# Patient Record
Sex: Female | Born: 1970 | Race: White | Hispanic: No | Smoking: Current every day smoker
Health system: Southern US, Community
[De-identification: ages and names within clinical notes are randomized; demographics above are authoritative.]

## PROBLEM LIST (undated history)

## (undated) DIAGNOSIS — R519 Headache, unspecified: Secondary | ICD-10-CM

## (undated) DIAGNOSIS — K219 Gastro-esophageal reflux disease without esophagitis: Secondary | ICD-10-CM

## (undated) DIAGNOSIS — T7840XA Allergy, unspecified, initial encounter: Secondary | ICD-10-CM

## (undated) DIAGNOSIS — G43909 Migraine, unspecified, not intractable, without status migrainosus: Secondary | ICD-10-CM

## (undated) HISTORY — DX: Migraine, unspecified, not intractable, without status migrainosus: G43.909

## (undated) HISTORY — DX: Gastro-esophageal reflux disease without esophagitis: K21.9

## (undated) HISTORY — DX: Allergy, unspecified, initial encounter: T78.40XA

## (undated) HISTORY — DX: Headache, unspecified: R51.9

---

## 2006-11-09 ENCOUNTER — Emergency Department: Payer: Self-pay | Admitting: Internal Medicine

## 2006-11-16 ENCOUNTER — Emergency Department: Payer: Self-pay | Admitting: Emergency Medicine

## 2006-11-30 ENCOUNTER — Encounter: Payer: Self-pay | Admitting: Obstetrics and Gynecology

## 2007-01-11 ENCOUNTER — Encounter: Payer: Self-pay | Admitting: Maternal and Fetal Medicine

## 2007-04-05 ENCOUNTER — Encounter: Payer: Self-pay | Admitting: Obstetrics and Gynecology

## 2007-06-22 ENCOUNTER — Observation Stay: Payer: Self-pay

## 2007-06-26 ENCOUNTER — Observation Stay: Payer: Self-pay

## 2007-06-27 ENCOUNTER — Inpatient Hospital Stay: Payer: Self-pay | Admitting: Obstetrics and Gynecology

## 2007-06-29 HISTORY — PX: TUBAL LIGATION: SHX77

## 2014-08-08 HISTORY — PX: BREAST CYST ASPIRATION: SHX578

## 2014-10-15 ENCOUNTER — Ambulatory Visit: Payer: Self-pay

## 2014-10-16 ENCOUNTER — Ambulatory Visit: Payer: Self-pay

## 2014-10-20 ENCOUNTER — Encounter: Payer: Self-pay | Admitting: *Deleted

## 2014-10-27 ENCOUNTER — Ambulatory Visit (INDEPENDENT_AMBULATORY_CARE_PROVIDER_SITE_OTHER): Payer: PRIVATE HEALTH INSURANCE | Admitting: General Surgery

## 2014-10-27 ENCOUNTER — Encounter: Payer: Self-pay | Admitting: General Surgery

## 2014-10-27 ENCOUNTER — Other Ambulatory Visit: Payer: PRIVATE HEALTH INSURANCE

## 2014-10-27 VITALS — BP 138/72 | HR 78 | Resp 14 | Ht 66.0 in | Wt 143.0 lb

## 2014-10-27 DIAGNOSIS — N6002 Solitary cyst of left breast: Secondary | ICD-10-CM

## 2014-10-27 NOTE — Progress Notes (Signed)
Patient ID: Tiffany Benton, female   DOB: 11-Dec-1970, 44 y.o.   MRN: [id number]  Chief Complaint  Patient presents with  . Other    mammogram    HPI Tiffany Benton is a 44 y.o. female who presents for a breast evaluation for a palpable mass in the left breast. The most recent mammogram and ultrasound was done on 10/16/14. Patient does perform regular self breast checks and gets regular mammograms done. She first noticed the lump in her left breast in November last year. She does report some tenderness.     HPI  Past Medical History  Diagnosis Date  . Migraine     Past Surgical History  Procedure Laterality Date  . Tubal ligation  06/29/07    Family History  Problem Relation Age of Onset  . Breast cancer Mother 55    metastatic  . Bone cancer Maternal Grandfather     Social History History  Substance Use Topics  . Smoking status: Current Every Day Smoker -- 1.00 packs/day for 25 years    Types: Cigarettes  . Smokeless tobacco: Not on file  . Alcohol Use: 0.0 oz/week    0 Standard drinks or equivalent per week    No Known Allergies  Current Outpatient Prescriptions  Medication Sig Dispense Refill  . Aspirin-Salicylamide-Caffeine [id number] MG PACK Take by mouth.     No current facility-administered medications for this visit.    Review of Systems Review of Systems  Constitutional: Negative.   Respiratory: Negative.   Cardiovascular: Negative.     Blood pressure 138/72, pulse 78, resp. rate 14, height 5\' 6"  (1.676 m), weight 143 lb (64.864 kg), last menstrual period 10/12/2014.  Physical Exam Physical Exam  Constitutional: She is oriented to person, place, and time. She appears well-developed and well-nourished.  Eyes: Conjunctivae are normal. No scleral icterus.  Neck: Neck supple.  Cardiovascular: Normal rate, regular rhythm and normal heart sounds.   Pulmonary/Chest: Effort normal and breath sounds normal. Right breast exhibits no  inverted nipple, no mass, no nipple discharge, no skin change and no tenderness. Left breast exhibits no inverted nipple, no mass, no nipple discharge, no skin change and no tenderness.  2.5 cm mass in the upper outer quadrant at 2 ocl of left breast, 3-4 cm from the nipple.   Abdominal: Soft. Bowel sounds are normal. There is no tenderness.  Lymphadenopathy:    She has no cervical adenopathy.    She has no axillary adenopathy.  Neurological: She is alert and oriented to person, place, and time.  Skin: Skin is warm and dry.    Data Reviewed Mammogram and Ultrasound reviewed-multiple cysts noted  Assessment    Multiple cysts in breasts. One larger 2-3 cm cyst in the upper outer quadrant of the left breast, around 2 ocl. Cyst was aspirated with US guidance- 8 ml benign appearing fluid removed and discarded. Cyst resolve  Fully and the palpable mass resolved also    Plan    Patient to return as needed. She can call if she has any further questions or concerns. Advised to seek attention if she feels a mass again       [facility] G 10/29/2014, 9:42 AM

## 2014-10-29 ENCOUNTER — Encounter: Payer: Self-pay | Admitting: General Surgery

## 2017-12-06 ENCOUNTER — Encounter: Payer: Self-pay | Admitting: Emergency Medicine

## 2017-12-06 ENCOUNTER — Emergency Department
Admission: EM | Admit: 2017-12-06 | Discharge: 2017-12-06 | Disposition: A | Discharge status: Home / Self Care | Payer: Self-pay | Attending: Emergency Medicine | Admitting: Emergency Medicine

## 2017-12-06 ENCOUNTER — Other Ambulatory Visit: Payer: Self-pay

## 2017-12-06 ENCOUNTER — Emergency Department: Payer: Self-pay

## 2017-12-06 DIAGNOSIS — R109 Unspecified abdominal pain: Secondary | ICD-10-CM

## 2017-12-06 DIAGNOSIS — K2970 Gastritis, unspecified, without bleeding: Secondary | ICD-10-CM | POA: Insufficient documentation

## 2017-12-06 DIAGNOSIS — F17210 Nicotine dependence, cigarettes, uncomplicated: Secondary | ICD-10-CM | POA: Insufficient documentation

## 2017-12-06 LAB — BASIC METABOLIC PANEL
Anion gap: 7 (ref 5–15)
BUN: 12 mg/dL (ref 6–20)
CALCIUM: 8.7 mg/dL — AB (ref 8.9–10.3)
CO2: 24 mmol/L (ref 22–32)
CREATININE: 0.84 mg/dL (ref 0.44–1.00)
Chloride: 108 mmol/L (ref 101–111)
GFR calc non Af Amer: >60 mL/min (ref >60)
Glucose, Bld: 87 mg/dL (ref 65–99)
Potassium: 3.5 mmol/L (ref 3.5–5.1)
SODIUM: 139 mmol/L (ref 135–145)

## 2017-12-06 LAB — URINALYSIS, COMPLETE (UACMP) WITH MICROSCOPIC
BILIRUBIN URINE: NEGATIVE
Glucose, UA: NEGATIVE mg/dL
KETONES UR: NEGATIVE mg/dL
Leukocytes, UA: NEGATIVE
Nitrite: NEGATIVE
Protein, ur: NEGATIVE mg/dL
Specific Gravity, Urine: 1.021 (ref 1.005–1.030)
pH: 5 (ref 5.0–8.0)

## 2017-12-06 LAB — HEPATIC FUNCTION PANEL
ALK PHOS: 71 U/L (ref 38–126)
ALT: 11 U/L — AB (ref 14–54)
AST: 19 U/L (ref 15–41)
Albumin: 3.9 g/dL (ref 3.5–5.0)
Bilirubin, Direct: <0.1 mg/dL — ABNORMAL LOW (ref 0.1–0.5)
TOTAL PROTEIN: 7.1 g/dL (ref 6.5–8.1)
Total Bilirubin: 0.4 mg/dL (ref 0.3–1.2)

## 2017-12-06 LAB — POCT PREGNANCY, URINE: Preg Test, Ur: NEGATIVE

## 2017-12-06 LAB — CBC
HCT: 43.8 % (ref 35.0–47.0)
Hemoglobin: 14.8 g/dL (ref 12.0–16.0)
MCH: 33.1 pg (ref 26.0–34.0)
MCHC: 33.8 g/dL (ref 32.0–36.0)
MCV: 98 fL (ref 80.0–100.0)
PLATELETS: 206 10*3/uL (ref 150–440)
RBC: 4.47 MIL/uL (ref 3.80–5.20)
RDW: 14.4 % (ref 11.5–14.5)
WBC: 14.1 10*3/uL — ABNORMAL HIGH (ref 3.6–11.0)

## 2017-12-06 LAB — LIPASE, BLOOD: LIPASE: 27 U/L (ref 11–51)

## 2017-12-06 MED ORDER — GI COCKTAIL ~~LOC~~
30.0000 mL | Freq: Once | ORAL | Status: AC
  Administered 2017-12-06: 30 mL via ORAL
  Filled 2017-12-06: qty 30

## 2017-12-06 MED ORDER — FAMOTIDINE 40 MG PO TABS: 40.0000 mg | ORAL_TABLET | Freq: Every evening | ORAL | 1 refills | Status: DC

## 2017-12-06 MED ORDER — IOPAMIDOL (ISOVUE-370) INJECTION 76%
75.0000 mL | Freq: Once | INTRAVENOUS | Status: AC | PRN
  Administered 2017-12-06: 75 mL via INTRAVENOUS

## 2017-12-06 MED ORDER — SUCRALFATE 1 G PO TABS: 1.0000 g | ORAL_TABLET | Freq: Four times a day (QID) | ORAL | 0 refills | Status: DC

## 2017-12-06 NOTE — ED Notes (Signed)
Lab notified to add on urine culture 

## 2017-12-06 NOTE — ED Triage Notes (Signed)
Patient complaining of right flank pain X 2-3 days getting gradually worse.  Denies dysuria.  States pain is "just there and constant".  Describes pain as sharp at times and constant soreness.

## 2017-12-06 NOTE — ED Notes (Signed)
EDP to bedside. 

## 2017-12-06 NOTE — ED Provider Notes (Signed)
[facility] Emergency Department Provider Note  ________________________________________   I have reviewed the triage vital signs and the nursing notes.   HISTORY  Chief Complaint Abdominal pain  History limited by: Not Limited   HPI Tiffany Benton is a 47 y.o. female who presents to the emergency department today because of concerns for abdominal pain.  Is located on the right side.  Started 3 days ago.  Located under her right ribs and radiates down the right side of her abdomen.  She states it is a constant ache however it does become sharp.  She has not noticed any change to the pain with eating.  Has not had any associated nausea or vomiting.  No change defecation or urination.  Patient denies any fevers.  Denies similar symptoms in the past.   Per medical record review patient has a history of migraine  Past Medical History:  Diagnosis Date  . Migraine     There are no active problems to display for this patient.   Past Surgical History:  Procedure Laterality Date  . TUBAL LIGATION  06/29/07    Prior to Admission medications   Medication Sig Start Date End Date Taking? Authorizing Provider  Aspirin-Salicylamide-Caffeine [id number] MG PACK Take by mouth.    [provider]    Allergies Patient has no known allergies.  Family History  Problem Relation Age of Onset  . Breast cancer Mother 37       metastatic  . Bone cancer Maternal Grandfather     Social History Social History   Tobacco Use  . Smoking status: Current Every Day Smoker    Packs/day: 1.00    Years: 25.00    Pack years: 25.00    Types: Cigarettes  Substance Use Topics  . Alcohol use: Yes    Alcohol/week: 0.0 oz  . Drug use: No    Review of Systems Constitutional: No fever/chills Eyes: No visual changes. ENT: No sore throat. Cardiovascular: Denies chest pain. Respiratory: Denies shortness of breath. Gastrointestinal: Positive for  right-sided abdominal pain Genitourinary: Negative for dysuria. Musculoskeletal: Negative for back pain. Skin: Negative for rash. Neurological: Negative for headaches, focal weakness or numbness.  ____________________________________________   PHYSICAL EXAM:  VITAL SIGNS: ED Triage Vitals  Enc Vitals Group     BP 12/06/17 1052 134/73     Pulse Rate 12/06/17 1052 83     Resp 12/06/17 1052 16     Temp 12/06/17 1052 98.2 F (36.8 C)     Temp Source 12/06/17 1052 Oral     SpO2 12/06/17 1052 95 %     Weight 12/06/17 1053 150 lb (68 kg)     Height 12/06/17 1053 5\' 6"  (1.676 m)     Head Circumference --      Peak Flow --      Pain Score 12/06/17 1052 4   Constitutional: Alert and oriented. Well appearing and in no distress. Eyes: Conjunctivae are normal.  ENT   Head: Normocephalic and atraumatic.   Nose: No congestion/rhinnorhea.   Mouth/Throat: Mucous membranes are moist.   Neck: No stridor. Hematological/Lymphatic/Immunilogical: No cervical lymphadenopathy. Cardiovascular: Normal rate, regular rhythm.  No murmurs, rubs, or gallops.  Respiratory: Normal respiratory effort without tachypnea nor retractions. Breath sounds are clear and equal bilaterally. No wheezes/rales/rhonchi. Gastrointestinal: Soft and non tender. No rebound. No guarding.  Genitourinary: Deferred Musculoskeletal: Normal range of motion in all extremities. No lower extremity edema. Neurologic:  Normal speech and language. No gross focal neurologic  deficits are appreciated.  Skin:  Skin is warm, dry and intact. No rash noted. Psychiatric: Mood and affect are normal. Speech and behavior are normal. Patient exhibits appropriate insight and judgment.  ____________________________________________    LABS (pertinent positives/negatives)  UA hazy, moderate urine dipstick, 11-20 RBCs, 0-5 WBCs BMP wnl except ca 8.7 CBC wbc 14.1, hgb 14.8, plt 206 Lipase 27 Hepatic function panel alt 11, d bili  <0.1 ____________________________________________   EKG  None  ____________________________________________    RADIOLOGY  CT abd.pel Findings consistent with possible gastritis  ____________________________________________   PROCEDURES  Procedures  ____________________________________________   INITIAL IMPRESSION / ASSESSMENT AND PLAN / ED COURSE  Pertinent labs & imaging results that were available during my care of the patient were reviewed by me and considered in my medical decision making (see chart for details).  Patient presented to the emergency department today because of concern for abdominal pain. On exam patient is tender on the right side of the abdomen. Differential would be broad including hepatitis, pancreatitis, gallbladder disease, appendicitis amongst other etiologies. Blood work and ct scan were performed. CT scan concerning for gastritis. Patient did feel better after gi cocktail. Will plan on discharging with antacid and sucralfate. Discussed findings with patient.    ____________________________________________   FINAL CLINICAL IMPRESSION(S) / ED DIAGNOSES  Final diagnoses:  Gastritis, presence of bleeding unspecified, unspecified chronicity, unspecified gastritis type  Abdominal pain, unspecified abdominal location     Note: This dictation was prepared with Dragon dictation. Any transcriptional errors that result from this process are unintentional     Nance Pear, MD 12/06/17 [id number]

## 2017-12-06 NOTE — ED Notes (Signed)
Patient transported to CT 

## 2017-12-06 NOTE — Discharge Instructions (Addendum)
Please seek medical attention for any high fevers, chest pain, shortness of breath, change in behavior, persistent vomiting, bloody stool or any other new or concerning symptoms.  

## 2017-12-07 LAB — URINE CULTURE: Culture: NO GROWTH

## 2018-10-03 ENCOUNTER — Ambulatory Visit: Payer: Self-pay | Attending: Oncology | Admitting: *Deleted

## 2018-10-03 ENCOUNTER — Encounter (INDEPENDENT_AMBULATORY_CARE_PROVIDER_SITE_OTHER): Payer: Self-pay

## 2018-10-03 VITALS — BP 129/89 | HR 83 | Temp 97.9°F | Ht 67.0 in | Wt 157.4 lb

## 2018-10-03 DIAGNOSIS — N644 Mastodynia: Secondary | ICD-10-CM

## 2018-10-03 NOTE — Patient Instructions (Signed)
Gave patient hand-out, Women Staying Healthy, Active and Well from [facility], with education on breast health, pap smears, heart and colon health. 

## 2018-10-03 NOTE — Progress Notes (Signed)
  Subjective:     Patient ID: Tiffany Benton, female   DOB: 01-25-1971, 48 y.o.   MRN: [id number]  HPI   Review of Systems     Objective:   Physical Exam Chest:     Breasts:        Right: No swelling, bleeding, inverted nipple, mass, nipple discharge, skin change or tenderness.        Left: Tenderness present. No bleeding, inverted nipple, mass, nipple discharge or skin change.    Lymphadenopathy:     Upper Body:     Right upper body: No supraclavicular or axillary adenopathy.     Left upper body: No supraclavicular or axillary adenopathy.        Assessment:     48 year old White female presents to [facility] for clinical breast exam and mammogram.  Patient states she has been having left breast pain.  States it's "constantly sore feeling".  States the soreness is all over, but she has more targeted pain at 6-8:00 along the inframammary ridge.States raising up on her bra strap seems to make it better.  States she does not wear an underwire bra.  States she does drink about 3 16 oz [employer] a day.  On clinical breast exam there is no dominant mass, skin changes or lymphadenopathy.  There is thickening at bilateral upper outer quadrants.  Review of last mammogram and ultrasound on 10/16/14 was a birads 2 with bilateral multiple cysts noted.  Taught self breast awareness.  Patient is currently on her menstrual cycle.  States she will get her pap at [facility] next month. Patient has been screened for eligibility.  She does not have any insurance, Medicare or Medicaid.  She also meets financial eligibility.  Hand-out given on the Affordable Care Act. Risk Assessment    Risk Scores      10/03/2018   Last edited by: Orson Slick, CMA   5-year risk: 2.6 %   Lifetime risk: 21.9 %             Plan:     Bilateral diagnostic mammogram and ultrasound ordered.  Will follow-up per [facility] protocol.

## 2018-10-08 ENCOUNTER — Ambulatory Visit
Admission: RE | Admit: 2018-10-08 | Discharge: 2018-10-08 | Disposition: A | Discharge status: Home / Self Care | Payer: Self-pay | Source: Ambulatory Visit | Attending: Oncology | Admitting: Oncology

## 2018-10-08 ENCOUNTER — Encounter: Payer: Self-pay | Admitting: *Deleted

## 2018-10-08 ENCOUNTER — Other Ambulatory Visit: Payer: Self-pay | Admitting: *Deleted

## 2018-10-08 DIAGNOSIS — N644 Mastodynia: Secondary | ICD-10-CM

## 2018-10-08 DIAGNOSIS — N63 Unspecified lump in unspecified breast: Secondary | ICD-10-CM

## 2018-10-08 NOTE — Progress Notes (Signed)
Letter mailed to inform patient of the need to return in 6 months for another mammogram and ultrasound of her left breast.  Appointment scheduled for April 11, 2019 @ 10:20.  [facility] to [place].

## 2018-11-08 ENCOUNTER — Encounter: Payer: Self-pay | Admitting: *Deleted

## 2019-04-11 ENCOUNTER — Other Ambulatory Visit: Payer: Self-pay

## 2019-12-13 ENCOUNTER — Encounter: Payer: Self-pay | Admitting: Family Medicine

## 2019-12-13 ENCOUNTER — Other Ambulatory Visit: Payer: Self-pay

## 2019-12-13 ENCOUNTER — Ambulatory Visit (INDEPENDENT_AMBULATORY_CARE_PROVIDER_SITE_OTHER): Payer: Medicaid Other | Admitting: Family Medicine

## 2019-12-13 VITALS — BP 129/53 | HR 79 | Temp 97.3°F | Ht 67.0 in | Wt 163.2 lb

## 2019-12-13 DIAGNOSIS — H6983 Other specified disorders of Eustachian tube, bilateral: Secondary | ICD-10-CM

## 2019-12-13 DIAGNOSIS — R202 Paresthesia of skin: Secondary | ICD-10-CM | POA: Diagnosis not present

## 2019-12-13 DIAGNOSIS — R200 Anesthesia of skin: Secondary | ICD-10-CM | POA: Diagnosis not present

## 2019-12-13 DIAGNOSIS — R2231 Localized swelling, mass and lump, right upper limb: Secondary | ICD-10-CM | POA: Diagnosis not present

## 2019-12-13 DIAGNOSIS — Z1231 Encounter for screening mammogram for malignant neoplasm of breast: Secondary | ICD-10-CM | POA: Diagnosis not present

## 2019-12-13 DIAGNOSIS — Z7689 Persons encountering health services in other specified circumstances: Secondary | ICD-10-CM

## 2019-12-13 DIAGNOSIS — Z0000 Encounter for general adult medical examination without abnormal findings: Secondary | ICD-10-CM | POA: Diagnosis not present

## 2019-12-13 DIAGNOSIS — H6993 Unspecified Eustachian tube disorder, bilateral: Secondary | ICD-10-CM | POA: Insufficient documentation

## 2019-12-13 MED ORDER — FLUTICASONE PROPIONATE 50 MCG/ACT NA SUSP
2.0000 | Freq: Every day | NASAL | 3 refills | Status: DC
Start: 2019-12-13 — End: 2023-10-02

## 2019-12-13 MED ORDER — PREDNISONE 20 MG PO TABS: 40.0000 mg | ORAL_TABLET | Freq: Every day | ORAL | 0 refills | Status: AC

## 2019-12-13 MED ORDER — GUAIFENESIN ER 1200 MG PO TB12: 1.0000 | ORAL_TABLET | Freq: Two times a day (BID) | ORAL | 0 refills | Status: DC

## 2019-12-13 MED ORDER — LORATADINE 10 MG PO TABS: 10.0000 mg | ORAL_TABLET | Freq: Every day | ORAL | 1 refills | Status: AC

## 2019-12-13 NOTE — Assessment & Plan Note (Signed)
Here to establish as new patient at [facility] 12/13/2019.  Reports completed 1-2 visits with previous PCP, Ranae Plumber, before COVID and otherwise has been without primary care.

## 2019-12-13 NOTE — Assessment & Plan Note (Signed)
Reports bilateral ear fullness with drainage, swishing sounds in the left ear x 1 year and bilateral high pitch noise x 6 months.  Reports had been seen in an urgent care around the beginning of COVID and was diagnosed with seasonal allergies. Had started a daily zyrtec, flonase and popping her ears with nasal pressure with some relief of symptoms but not full relief of symptoms.  Has not continued these treatments.  Discussed is eustachian tube dysfunction, will restart on allergy control medications as well as mucinex and will do a 5 day course of prednisone to help with clearing of secretions.  Discussed if this does not clear up concerns, will refer to ENT for additional evaluation.  Plan: 1. Take medications as prescribed (loratadine, flonase, mucinex and prednisone). 2. Follow up in 1 week

## 2019-12-13 NOTE — Patient Instructions (Signed)
As we discussed, you have eustachian tube dysfunction in both ears.  I have sent in a few prescriptions for this.  Loratadine 10mg  daily (generic for claritin).  Take 1 tablet daily for allergy management Flonase (2 sprays in each nostril daily).  Take to help with clearing of allergens Mucinex - 1 tablet 2x per day for the next 5 days.  This will help thin the fluids that are in your ears so they are easier to clear Prednisone 20mg  (take 40mg  once daily - 2 tablets) for the next 5 days.  I have sent in a referral to Orthopedics for evaluation of the subcutaneous mass in your right hand.  If you do not hear anything in 1 week from either the referral coordinator or the orthopedic practice, please contact our office and we will touch base with them.  As we discussed, appears to be radiculopathy in your left leg.  The prednisone that you will be taking for your Eustachian Tube Dysfunction can also help reduce your left leg symptoms.  We will plan to see you back in 1 week for follow up on your left leg symptoms, ear symptoms, for PAP testing and your TDAP vaccination.  You will receive a survey after today's visit either digitally by e-mail or paper by [employer]. Your experiences and feedback matter to [place].  Please respond so we know how we are doing as we provide care for you.  Call us with any questions/concerns/needs.  It is my goal to be available to you for your health concerns.  Thanks for choosing me to be a partner in your healthcare needs!  Harlin Rain, FNP-C Family Nurse Practitioner [facility] Phone: [phone]

## 2019-12-13 NOTE — Progress Notes (Signed)
Subjective:    Patient ID: Tiffany Benton, female    DOB: October 09, 1970, 49 y.o.   MRN: [id number]  Tiffany Benton is a 49 y.o. female presenting on 12/13/2019 for Establish Care (bilateral ear fullness w/ drainage, swishing sounds in the Lt ear. x 1 yr  Bilateral high pitch sounds x 69mhs), Leg Problem (intermittent left leg numbness, burning sensation  in the buttocks that radiates down leg. x 1 Pt unsure that the leg pain could be related to a fall x1 yr ago. She tripped over her dog and landed on the left side.), and Hand Pain (Right hand pain w/ knot about 1cm in the palm of the hand )   HPI  Previous PCP was LRanae Plumber  Records will be requested.  Past medical, family, and surgical history reviewed w/ pt.  Tiffany Benton to clinic to establish care and for concerns of bilateral ears, left leg and right hand.  Reports having bilateral ear fullness without drainage, swishing sounds in the left ear x 1 year and bilateral high pitch noise x 6 months.  Reports was seen in urgent care last March and diagnosed with seasonal allergies, was told to take over the counter allergy medication, flonase and to hold her nose to pop her ears open.   Reports some symptom improvement but not full resolution.  Has stopped these over the counter medications.  Denies dizziness, lightheadedness, vertigo, history of recurrent ear infections, drainage/pain near/in ear, or putting anything in her ear.  Reports concerns with her right hand.  Approximately 1 month ago found a palpable mass at the base of her right 4th digit that moves when she moves her fingers.  Reports increasing tenderness since she found it 1 month ago and that she has begun to have decreased grip strength in her right hand.  Denies any previous right hand injury or diagnoses.  Has not met with any providers regarding this.  Has concerns of left left numbness/tingling down her entire leg and including her entire left foot.   Had a fall >1 year ago landing on her left side and has been having symptoms since.  Denies any changes in bowel/bladder function, saddle anesthesia, foot drop, problems with ambulation.  Reports once in a while can change positions and have temporary relief of her symptoms.  Denies any previous injuries/trauma, back diagnoses, symptoms or treatment.   Depression screen PHQ 2/9 12/13/2019  Decreased Interest 0  Down, Depressed, Hopeless 0  PHQ - 2 Score 0    Past Medical History:  Diagnosis Date  . Allergy   . Frequent headaches   . Migraine    Past Surgical History:  Procedure Laterality Date  . BREAST CYST ASPIRATION Left 2016   per pt  . TUBAL LIGATION  06/29/07   Social History   Socioeconomic History  . Marital status: Single    Spouse name: Not on file  . Number of children: Not on file  . Years of education: Not on file  . Highest education level: Not on file  Occupational History  . Not on file  Tobacco Use  . Smoking status: Current Every Day Smoker    Packs/day: 1.00    Years: 25.00    Pack years: 25.00    Types: Cigarettes  . Smokeless tobacco: Never Used  Substance and Sexual Activity  . Alcohol use: Not Currently    Alcohol/week: 0.0 standard drinks  . Drug use: No  . Sexual activity: Not on file  Other Topics Concern  . Not on file  Social History Narrative  . Not on file   Social Determinants of Health   Financial Resource Strain:   . Difficulty of Paying Living Expenses:   Food Insecurity:   . Worried About Charity fundraiser in the Last Year:   . Arboriculturist in the Last Year:   Transportation Needs:   . Film/video editor (Medical):   Marland Kitchen Lack of Transportation (Non-Medical):   Physical Activity:   . Days of Exercise per Week:   . Minutes of Exercise per Session:   Stress:   . Feeling of Stress :   Social Connections:   . Frequency of Communication with Friends and Family:   . Frequency of Social Gatherings with Friends and  Family:   . Attends Religious Services:   . Active Member of Clubs or Organizations:   . Attends Archivist Meetings:   Marland Kitchen Marital Status:   Intimate Partner Violence:   . Fear of Current or Ex-Partner:   . Emotionally Abused:   Marland Kitchen Physically Abused:   . Sexually Abused:    Family History  Problem Relation Age of Onset  . Breast cancer Mother 76       metastatic  . Bone cancer Maternal Grandfather    Current Outpatient Medications on File Prior to Visit  Medication Sig  . acetaminophen (TYLENOL) 500 MG tablet Take 500 mg by mouth every 6 (six) hours as needed.  . Aspirin-Salicylamide-Caffeine (BC FAST PAIN RELIEF) 650-195-33.3 MG PACK Take by mouth.  . calcium carbonate (TUMS - DOSED IN MG ELEMENTAL CALCIUM) 500 MG chewable tablet Chew 1 tablet by mouth daily as needed for indigestion or heartburn.   No current facility-administered medications on file prior to visit.    Per HPI unless specifically indicated above     Objective:    BP (!) 129/53   Pulse 79   Temp (!) 97.3 F (36.3 C) (Temporal)   Ht 5' 7"  (1.702 m)   Wt 163 lb 3.2 oz (74 kg)   BMI 25.56 kg/m   Wt Readings from Last 3 Encounters:  12/13/19 163 lb 3.2 oz (74 kg)  10/03/18 157 lb 6.4 oz (71.4 kg)  12/06/17 150 lb (68 kg)    Physical Exam Vitals reviewed.  Constitutional:      General: She is not in acute distress.    Appearance: Normal appearance. She is well-developed, well-groomed and overweight. She is not ill-appearing or toxic-appearing.  HENT:     Head: Normocephalic.     Right Ear: Ear canal and external ear normal. No laceration, drainage, swelling or tenderness. There is no impacted cerumen. No foreign body. No hemotympanum. Tympanic membrane is bulging. Tympanic membrane is not injected, scarred, perforated, erythematous or retracted.     Left Ear: Ear canal and external ear normal. No laceration, drainage, swelling or tenderness. There is no impacted cerumen. No foreign body. No  hemotympanum. Tympanic membrane is bulging. Tympanic membrane is not injected, scarred, perforated, erythematous or retracted.     Ears:     Comments: Clear fluid behind both TM    Nose:     Comments: Facemask is in place, covering mouth and nose     Mouth/Throat:     Lips: Pink.     Pharynx: Uvula midline.  Eyes:     General: Lids are normal. Vision grossly intact. No scleral icterus.       Right eye: No discharge.  Left eye: No discharge.     Extraocular Movements: Extraocular movements intact.     Conjunctiva/sclera: Conjunctivae normal.     Pupils: Pupils are equal, round, and reactive to light.  Neck:     Thyroid: No thyroid mass or thyromegaly.  Cardiovascular:     Rate and Rhythm: Normal rate and regular rhythm.     Pulses: Normal pulses.          Dorsalis pedis pulses are 2+ on the right side and 2+ on the left side.     Heart sounds: Normal heart sounds. No murmur. No friction rub. No gallop.   Pulmonary:     Effort: Pulmonary effort is normal. No respiratory distress.     Breath sounds: Normal breath sounds.  Abdominal:     Palpations: There is no hepatomegaly or splenomegaly.  Musculoskeletal:        General: Normal range of motion.     Right wrist: Swelling and tenderness present. No deformity, bony tenderness or snuff box tenderness. Normal range of motion.     Cervical back: Normal, normal range of motion and neck supple. No tenderness.     Thoracic back: Normal.     Lumbar back: Normal.     Right lower leg: Normal. No edema.     Left lower leg: Normal. No edema.     Right ankle: Normal.     Left ankle: Normal.     Right foot: Normal.     Left foot: Normal.     Comments: Subcutaneous mass, right hand, base of 4th digit, palpable and moves with finger movement.  Feet:     Right foot:     Skin integrity: Skin integrity normal.     Left foot:     Skin integrity: Skin integrity normal.  Lymphadenopathy:     Cervical: No cervical adenopathy.  Skin:     General: Skin is warm and dry.     Capillary Refill: Capillary refill takes less than 2 seconds.  Neurological:     General: No focal deficit present.     Mental Status: She is alert and oriented to person, place, and time.     Cranial Nerves: No cranial nerve deficit.     Sensory: No sensory deficit.     Motor: No weakness.     Coordination: Coordination normal.     Gait: Gait normal.     Deep Tendon Reflexes: Reflexes normal.  Psychiatric:        Attention and Perception: Attention and perception normal.        Mood and Affect: Mood and affect normal.        Speech: Speech normal.        Behavior: Behavior normal. Behavior is cooperative.        Thought Content: Thought content normal.        Cognition and Memory: Cognition and memory normal.        Judgment: Judgment normal.     Results for orders placed or performed during the hospital encounter of 12/06/17  Urine Culture   Specimen: Urine, Random  Result Value Ref Range   Specimen Description      URINE, RANDOM Performed at [facility], [address], [place], [place] [address]    Special Requests      NONE Performed at [facility], [address] [place], [place] [address]    Culture      NO GROWTH Performed at [facility], [address] [place], [place] [address]    Report Status 12/07/2017 FINAL   Urinalysis, Complete w Microscopic  Result Value Ref Range   Color, Urine YELLOW (A) YELLOW   APPearance HAZY (A) CLEAR   Specific Gravity, Urine 1.021 1.005 - 1.030   pH 5.0 5.0 - 8.0   Glucose, UA NEGATIVE NEGATIVE mg/dL   Hgb urine dipstick MODERATE (A) NEGATIVE   Bilirubin Urine NEGATIVE NEGATIVE   Ketones, ur NEGATIVE NEGATIVE mg/dL   Protein, ur NEGATIVE NEGATIVE mg/dL   Nitrite NEGATIVE NEGATIVE   Leukocytes, UA NEGATIVE NEGATIVE   RBC / HPF 11-20 0 - 5 RBC/hpf   WBC, UA 0-5 0 - 5 WBC/hpf   Bacteria, UA RARE (A) NONE SEEN   Squamous Epithelial / LPF 0-5 0 - 5   Mucus  PRESENT    Hyaline Casts, UA PRESENT   Basic metabolic panel  Result Value Ref Range   Sodium 139 135 - 145 mmol/L   Potassium 3.5 3.5 - 5.1 mmol/L   Chloride 108 101 - 111 mmol/L   CO2 24 22 - 32 mmol/L   Glucose, Bld 87 65 - 99 mg/dL   BUN 12 6 - 20 mg/dL   Creatinine, Ser 0.84 0.44 - 1.00 mg/dL   Calcium 8.7 (L) 8.9 - 10.3 mg/dL   GFR calc non Af Amer >60 >60 mL/min   GFR calc Af Amer >60 >60 mL/min   Anion gap 7 5 - 15  CBC  Result Value Ref Range   WBC 14.1 (H) 3.6 - 11.0 K/uL   RBC 4.47 3.80 - 5.20 MIL/uL   Hemoglobin 14.8 12.0 - 16.0 g/dL   HCT 43.8 35.0 - 47.0 %   MCV 98.0 80.0 - 100.0 fL   MCH 33.1 26.0 - 34.0 pg   MCHC 33.8 32.0 - 36.0 g/dL   RDW 14.4 11.5 - 14.5 %   Platelets 206 150 - 440 K/uL  Lipase, blood  Result Value Ref Range   Lipase 27 11 - 51 U/L  Hepatic function panel  Result Value Ref Range   Total Protein 7.1 6.5 - 8.1 g/dL   Albumin 3.9 3.5 - 5.0 g/dL   AST 19 15 - 41 U/L   ALT 11 (L) 14 - 54 U/L   Alkaline Phosphatase 71 38 - 126 U/L   Total Bilirubin 0.4 0.3 - 1.2 mg/dL   Bilirubin, Direct <0.1 (L) 0.1 - 0.5 mg/dL   Indirect Bilirubin NOT CALCULATED 0.3 - 0.9 mg/dL  Pregnancy, urine POC  Result Value Ref Range   Preg Test, Ur NEGATIVE NEGATIVE      Assessment & Plan:   Problem List Items Addressed This Visit      Nervous and Auditory   Eustachian tube dysfunction, bilateral    Reports bilateral ear fullness with drainage, swishing sounds in the left ear x 1 year and bilateral high pitch noise x 6 months.  Reports had been seen in an urgent care around the beginning of COVID and was diagnosed with seasonal allergies. Had started a daily zyrtec, flonase and popping her ears with nasal pressure with some relief of symptoms but not full relief of symptoms.  Has not continued these treatments.  Discussed is eustachian tube dysfunction, will restart on allergy control medications as well as mucinex and will do a 5 day course of prednisone to  help with clearing of secretions.  Discussed if this does not clear up concerns, will refer to ENT for additional evaluation.  Plan: 1. Take medications as prescribed (loratadine, flonase, mucinex and prednisone). 2. Follow up in 1 week        Other   Encounter to establish care with new doctor    Here to establish as new patient at [facility] 12/13/2019.  Reports completed 1-2 visits with previous PCP, Ranae Plumber, before COVID and otherwise has been without primary care.        Numbness and tingling of left leg    History of fall x 1 year ago onto left side.  Intermittent left lower back pain but reports constant radicular symptoms of pain/numbness/tingling down left leg through foot.  Reports entire foot with feeling numbness/tingling, feels at time she has some difficulty with ambulation.  No previous low back injury/trauma/fall or known herniated disc.  Plan: 1. Prednisone prescribed for eustachian tube dysfunction should reduce/alleviate the radicular symptoms while using medication. 2. Discussed if relief of symptoms and then resumption of symptoms, will refer to Orthopedics for probable lumbar MRI and further treatment plan.      Subcutaneous mass of right hand    Subcutaneous mass of right hand, at base of 4th digit and moves with finger movement.  Reports first noticed about 1 month ago and has noticed worsening with her grip strength and interference with her ADLs.  Plan: 1. Referral to Orthopedics for probable hand MRI and further evaluation      Relevant Orders   Ambulatory referral to Orthopedic Surgery    Other Visit Diagnoses    Routine medical exam    -  Primary   Relevant Orders   CBC with Differential   COMPLETE METABOLIC PANEL WITH GFR   Lipid Profile   Thyroid Panel With TSH   Encounter for screening mammogram for malignant neoplasm of breast       Relevant Orders   MM 3D SCREEN BREAST BILATERAL   Dysfunction of both eustachian tubes       Relevant  Medications   loratadine (CLARITIN) 10 MG tablet   fluticasone (FLONASE) 50 MCG/ACT nasal spray   Guaifenesin (MUCINEX MAXIMUM STRENGTH) 1200 MG TB12   predniSONE (DELTASONE) 20 MG tablet      Meds ordered this encounter  Medications  . loratadine (CLARITIN) 10 MG tablet    Sig: Take 1 tablet (10 mg total) by mouth daily.    Dispense:  90 tablet    Refill:  1  . fluticasone (FLONASE) 50 MCG/ACT nasal spray    Sig: Place 2 sprays into both nostrils daily.    Dispense:  16 g    Refill:  3  . Guaifenesin (MUCINEX MAXIMUM STRENGTH) 1200 MG TB12    Sig: Take 1 tablet (1,200 mg total) by mouth 2 (two) times daily.    Dispense:  10 tablet    Refill:  0  . predniSONE (DELTASONE) 20 MG tablet    Sig: Take 2 tablets (40 mg total) by mouth daily with breakfast for 5 days.    Dispense:  10 tablet    Refill:  0      Follow up plan: Return in about 1 week (around 12/20/2019) for Follow up leg symptoms, ear symptoms and PAP/TDAP.  Harlin Rain, FNP-C Family Nurse Practitioner [facility] 12/13/2019, 11:39 AM

## 2019-12-13 NOTE — Assessment & Plan Note (Signed)
Subcutaneous mass of right hand, at base of 4th digit and moves with finger movement.  Reports first noticed about 1 month ago and has noticed worsening with her grip strength and interference with her ADLs.  Plan: 1. Referral to Orthopedics for probable hand MRI and further evaluation

## 2019-12-13 NOTE — Assessment & Plan Note (Signed)
History of fall x 1 year ago onto left side.  Intermittent left lower back pain but reports constant radicular symptoms of pain/numbness/tingling down left leg through foot.  Reports entire foot with feeling numbness/tingling, feels at time she has some difficulty with ambulation.  No previous low back injury/trauma/fall or known herniated disc.  Plan: 1. Prednisone prescribed for eustachian tube dysfunction should reduce/alleviate the radicular symptoms while using medication. 2. Discussed if relief of symptoms and then resumption of symptoms, will refer to Orthopedics for probable lumbar MRI and further treatment plan.

## 2019-12-16 ENCOUNTER — Other Ambulatory Visit: Payer: Self-pay

## 2019-12-16 ENCOUNTER — Other Ambulatory Visit: Payer: Medicaid Other

## 2019-12-16 DIAGNOSIS — Z0000 Encounter for general adult medical examination without abnormal findings: Secondary | ICD-10-CM | POA: Diagnosis not present

## 2019-12-17 ENCOUNTER — Other Ambulatory Visit: Payer: Self-pay | Admitting: Family Medicine

## 2019-12-17 DIAGNOSIS — D72820 Lymphocytosis (symptomatic): Secondary | ICD-10-CM

## 2019-12-17 LAB — CBC WITH DIFFERENTIAL/PLATELET
Absolute Monocytes: 663 cells/uL (ref 200–950)
Basophils Absolute: 65 cells/uL (ref 0–200)
Basophils Relative: 0.5 %
Eosinophils Absolute: 78 cells/uL (ref 15–500)
Eosinophils Relative: 0.6 %
HCT: 41.6 % (ref 35.0–45.0)
Hemoglobin: 13.8 g/dL (ref 11.7–15.5)
Lymphs Abs: 5408 cells/uL — ABNORMAL HIGH (ref 850–3900)
MCH: 32.7 pg (ref 27.0–33.0)
MCHC: 33.2 g/dL (ref 32.0–36.0)
MCV: 98.6 fL (ref 80.0–100.0)
MPV: 11.3 fL (ref 7.5–12.5)
Monocytes Relative: 5.1 %
Neutro Abs: 6786 cells/uL (ref 1500–7800)
Neutrophils Relative %: 52.2 %
Platelets: 224 10*3/uL (ref 140–400)
RBC: 4.22 10*6/uL (ref 3.80–5.10)
RDW: 12.7 % (ref 11.0–15.0)
Total Lymphocyte: 41.6 %
WBC: 13 10*3/uL — ABNORMAL HIGH (ref 3.8–10.8)

## 2019-12-17 LAB — COMPLETE METABOLIC PANEL WITH GFR
AG Ratio: 1.9 (calc) (ref 1.0–2.5)
ALT: 8 U/L (ref 6–29)
AST: 13 U/L (ref 10–35)
Albumin: 3.9 g/dL (ref 3.6–5.1)
Alkaline phosphatase (APISO): 68 U/L (ref 31–125)
BUN: 18 mg/dL (ref 7–25)
CO2: 24 mmol/L (ref 20–32)
Calcium: 8.7 mg/dL (ref 8.6–10.2)
Chloride: 110 mmol/L (ref 98–110)
Creat: 0.77 mg/dL (ref 0.50–1.10)
GFR, Est African American: 105 mL/min/{1.73_m2} (ref >=60)
GFR, Est Non African American: 91 mL/min/{1.73_m2} (ref >=60)
Globulin: 2.1 g/dL (calc) (ref 1.9–3.7)
Glucose, Bld: 74 mg/dL (ref 65–99)
Potassium: 3.6 mmol/L (ref 3.5–5.3)
Sodium: 142 mmol/L (ref 135–146)
Total Bilirubin: 0.2 mg/dL (ref 0.2–1.2)
Total Protein: 6 g/dL — ABNORMAL LOW (ref 6.1–8.1)

## 2019-12-17 LAB — LIPID PANEL
Cholesterol: 220 mg/dL — ABNORMAL HIGH (ref <200)
HDL: 46 mg/dL — ABNORMAL LOW (ref >=50)
LDL Cholesterol (Calc): 154 mg/dL (calc) — ABNORMAL HIGH
Non-HDL Cholesterol (Calc): 174 mg/dL (calc) — ABNORMAL HIGH (ref <130)
Total CHOL/HDL Ratio: 4.8 (calc) (ref <5.0)
Triglycerides: 93 mg/dL (ref <150)

## 2019-12-17 LAB — THYROID PANEL WITH TSH
Free Thyroxine Index: 1.4 (ref 1.4–3.8)
T3 Uptake: 29 % (ref 22–35)
T4, Total: 4.7 ug/dL — ABNORMAL LOW (ref 5.1–11.9)
TSH: 1.48 mIU/L

## 2019-12-20 ENCOUNTER — Encounter: Payer: Self-pay | Admitting: Family Medicine

## 2019-12-20 ENCOUNTER — Ambulatory Visit (INDEPENDENT_AMBULATORY_CARE_PROVIDER_SITE_OTHER)
Admission: RE | Admit: 2019-12-20 | Discharge: 2019-12-20 | Disposition: A | Discharge status: Home / Self Care | Payer: Medicaid Other | Source: Ambulatory Visit | Attending: Family Medicine | Admitting: Family Medicine

## 2019-12-20 ENCOUNTER — Other Ambulatory Visit: Payer: Self-pay | Admitting: Family Medicine

## 2019-12-20 ENCOUNTER — Other Ambulatory Visit: Payer: Self-pay

## 2019-12-20 ENCOUNTER — Ambulatory Visit (INDEPENDENT_AMBULATORY_CARE_PROVIDER_SITE_OTHER): Payer: Medicaid Other | Admitting: Family Medicine

## 2019-12-20 ENCOUNTER — Ambulatory Visit
Admission: RE | Admit: 2019-12-20 | Discharge: 2019-12-20 | Disposition: A | Discharge status: Home / Self Care | Payer: Medicaid Other | Attending: Family Medicine | Admitting: Family Medicine

## 2019-12-20 VITALS — BP 125/56 | HR 79 | Temp 97.5°F | Ht 67.0 in | Wt 165.8 lb

## 2019-12-20 DIAGNOSIS — H9319 Tinnitus, unspecified ear: Secondary | ICD-10-CM | POA: Insufficient documentation

## 2019-12-20 DIAGNOSIS — H6983 Other specified disorders of Eustachian tube, bilateral: Secondary | ICD-10-CM

## 2019-12-20 DIAGNOSIS — R062 Wheezing: Secondary | ICD-10-CM

## 2019-12-20 DIAGNOSIS — Z23 Encounter for immunization: Secondary | ICD-10-CM

## 2019-12-20 DIAGNOSIS — R9389 Abnormal findings on diagnostic imaging of other specified body structures: Secondary | ICD-10-CM

## 2019-12-20 DIAGNOSIS — H9313 Tinnitus, bilateral: Secondary | ICD-10-CM

## 2019-12-20 DIAGNOSIS — R202 Paresthesia of skin: Secondary | ICD-10-CM

## 2019-12-20 DIAGNOSIS — R200 Anesthesia of skin: Secondary | ICD-10-CM

## 2019-12-20 NOTE — Assessment & Plan Note (Signed)
History of fall x 1 year ago onto left side.  Continued intermittent left lower back pain with constant radiculopathy of pain, numbness and tingling down the left leg through the left foot.  Reports entire foot numb and some difficulty with ambulation at times.  Minimal improvement with 5 day Prednisone trial.  Will refer to Orthopedics.  Plan: 1. Referral to Orthopedics.

## 2019-12-20 NOTE — Assessment & Plan Note (Signed)
Due for TDAP vaccination.  Agreeable to vaccination today.  Plan: 1. TDAP Vaccine given in clinic today.

## 2019-12-20 NOTE — Progress Notes (Signed)
Subjective:    Patient ID: Tiffany Benton, female    DOB: 1971/04/29, 49 y.o.   MRN: [id number]  Tiffany Benton is a 49 y.o. female presenting on 12/20/2019 for Leg Problem (numbness and tingling of left leg. Pt state the numbness, pain is still present, but notice the burning sensation subsided after taking the steroid ) and Tinnitus (pt describe it as a persistent loud swishing sound. No improvement with taking the steroid medication)   HPI  Ms. Ocegueda presents to clinic for follow up on her left leg numbness/tingling, with continued tinnitus in her ears.  Reports completed the steroids x 5 days with improvement in the burning of her left lower extremity but still has continued numbness/tingling/pain from her lower back through her foot.  Has continued loud swishing sound, [place], but very loudly, in bilateral ears.  Reports no improvement with taking the steroid medication, flonase, mucinex or allergy medication.    Depression screen PHQ 2/9 12/13/2019  Decreased Interest 0  Down, Depressed, Hopeless 0  PHQ - 2 Score 0    Social History   Tobacco Use  . Smoking status: Current Every Day Smoker    Packs/day: 1.00    Years: 25.00    Pack years: 25.00    Types: Cigarettes  . Smokeless tobacco: Never Used  Substance Use Topics  . Alcohol use: Not Currently    Alcohol/week: 0.0 standard drinks  . Drug use: No    Review of Systems  Constitutional: Negative.   HENT: Positive for tinnitus. Negative for congestion, dental problem, drooling, ear discharge, ear pain, facial swelling, hearing loss, mouth sores, nosebleeds, postnasal drip, rhinorrhea, sinus pressure, sinus pain, sneezing, sore throat, trouble swallowing and voice change.   Eyes: Negative.   Respiratory: Negative.   Cardiovascular: Negative.   Gastrointestinal: Negative.   Endocrine: Negative.   Genitourinary: Negative.     Musculoskeletal: Positive for arthralgias and myalgias. Negative for back pain, gait problem, joint swelling, neck pain and neck stiffness.  Skin: Negative.   Allergic/Immunologic: Negative.   Neurological: Positive for numbness. Negative for dizziness, tremors, seizures, syncope, facial asymmetry, speech difficulty, weakness, light-headedness and headaches.  Hematological: Negative.   Psychiatric/Behavioral: Negative.    Per HPI unless specifically indicated above     Objective:    BP (!) 125/56   Pulse 79   Temp (!) 97.5 F (36.4 C) (Temporal)   Ht 5\' 7"  (1.702 m)   Wt 165 lb 12.8 oz (75.2 kg)   LMP 12/19/2019   BMI 25.97 kg/m   Wt Readings from Last 3 Encounters:  12/20/19 165 lb 12.8 oz (75.2 kg)  12/13/19 163 lb 3.2 oz (74 kg)  10/03/18 157 lb 6.4 oz (71.4 kg)    Physical Exam Vitals reviewed.  Constitutional:      General: She is not in acute distress.    Appearance: Normal appearance. She is well-developed, well-groomed and overweight. She is not ill-appearing or toxic-appearing.  HENT:     Head: Normocephalic.     Right Ear: No laceration, drainage, swelling or tenderness. No foreign body. No hemotympanum. Tympanic membrane is bulging. Tympanic membrane is not injected, scarred, perforated, erythematous or retracted.     Left Ear: No laceration, drainage, swelling or tenderness. No foreign body. No hemotympanum. Tympanic membrane is bulging. Tympanic membrane is not injected, scarred, perforated, erythematous or retracted.     Ears:     Comments: Clear fluid  behind both TM    Nose:     Comments: Lizbeth Bark is in place, covering mouth and nose  Eyes:     General: Lids are normal. Vision grossly intact. No scleral icterus.       Right eye: No discharge.        Left eye: No discharge.     Extraocular Movements: Extraocular movements intact.     Conjunctiva/sclera: Conjunctivae normal.     Pupils: Pupils are equal, round, and reactive to light.  Neck:     Thyroid:  No thyroid mass or thyromegaly.  Cardiovascular:     Rate and Rhythm: Normal rate and regular rhythm.     Pulses: Normal pulses.     Heart sounds: Normal heart sounds. No murmur. No friction rub. No gallop.   Pulmonary:     Effort: Pulmonary effort is normal. No tachypnea, bradypnea, accessory muscle usage, prolonged expiration, respiratory distress or retractions.     Breath sounds: No stridor. Examination of the right-lower field reveals wheezing. Wheezing present. No rhonchi or rales.     Comments: Large inspiratory/expiratory wheeze RLL Chest:     Chest wall: No tenderness.  Musculoskeletal:        General: Normal range of motion.     Cervical back: Normal and normal range of motion.     Thoracic back: Normal.     Lumbar back: Normal.     Right lower leg: Normal. No edema.     Left lower leg: Normal. No edema.     Right ankle: Normal.     Left ankle: Normal.     Right foot: Normal.     Left foot: Normal.  Skin:    General: Skin is warm and dry.     Capillary Refill: Capillary refill takes less than 2 seconds.  Neurological:     General: No focal deficit present.     Mental Status: She is alert and oriented to person, place, and time.     Cranial Nerves: No cranial nerve deficit.     Sensory: No sensory deficit.     Motor: No weakness.     Coordination: Coordination normal.     Gait: Gait normal.     Deep Tendon Reflexes: Reflexes normal.  Psychiatric:        Attention and Perception: Attention and perception normal.        Mood and Affect: Mood and affect normal.        Speech: Speech normal.        Behavior: Behavior normal. Behavior is cooperative.        Thought Content: Thought content normal.        Cognition and Memory: Cognition and memory normal.        Judgment: Judgment normal.    Results for orders placed or performed in visit on 12/13/19  CBC with Differential  Result Value Ref Range   WBC 13.0 (H) 3.8 - 10.8 Thousand/uL   RBC 4.22 3.80 - 5.10  Million/uL   Hemoglobin 13.8 11.7 - 15.5 g/dL   HCT 41.6 35.0 - 45.0 %   MCV 98.6 80.0 - 100.0 fL   MCH 32.7 27.0 - 33.0 pg   MCHC 33.2 32.0 - 36.0 g/dL   RDW 12.7 11.0 - 15.0 %   Platelets 224 140 - 400 Thousand/uL   MPV 11.3 7.5 - 12.5 fL   Neutro Abs 6,786 1,500 - 7,800 cells/uL   Lymphs Abs 5,408 (H) 850 - 3,900 cells/uL  Absolute Monocytes 663 200 - 950 cells/uL   Eosinophils Absolute 78 15 - 500 cells/uL   Basophils Absolute 65 0 - 200 cells/uL   Neutrophils Relative % 52.2 %   Total Lymphocyte 41.6 %   Monocytes Relative 5.1 %   Eosinophils Relative 0.6 %   Basophils Relative 0.5 %  COMPLETE METABOLIC PANEL WITH GFR  Result Value Ref Range   Glucose, Bld 74 65 - 99 mg/dL   BUN 18 7 - 25 mg/dL   Creat 0.77 0.50 - 1.10 mg/dL   GFR, Est Non African American 91 > OR = 60 mL/min/1.60m2   GFR, Est African American 105 > OR = 60 mL/min/1.24m2   BUN/Creatinine Ratio NOT APPLICABLE 6 - 22 (calc)   Sodium 142 135 - 146 mmol/L   Potassium 3.6 3.5 - 5.3 mmol/L   Chloride 110 98 - 110 mmol/L   CO2 24 20 - 32 mmol/L   Calcium 8.7 8.6 - 10.2 mg/dL   Total Protein 6.0 (L) 6.1 - 8.1 g/dL   Albumin 3.9 3.6 - 5.1 g/dL   Globulin 2.1 1.9 - 3.7 g/dL (calc)   AG Ratio 1.9 1.0 - 2.5 (calc)   Total Bilirubin 0.2 0.2 - 1.2 mg/dL   Alkaline phosphatase (APISO) 68 31 - 125 U/L   AST 13 10 - 35 U/L   ALT 8 6 - 29 U/L  Lipid Profile  Result Value Ref Range   Cholesterol 220 (H) <200 mg/dL   HDL 46 (L) > OR = 50 mg/dL   Triglycerides 93 <150 mg/dL   LDL Cholesterol (Calc) 154 (H) mg/dL (calc)   Total CHOL/HDL Ratio 4.8 <5.0 (calc)   Non-HDL Cholesterol (Calc) 174 (H) <130 mg/dL (calc)  Thyroid Panel With TSH  Result Value Ref Range   T3 Uptake 29 22 - 35 %   T4, Total 4.7 (L) 5.1 - 11.9 mcg/dL   Free Thyroxine Index 1.4 1.4 - 3.8   TSH 1.48 mIU/L      Assessment & Plan:   Problem List Items Addressed This Visit      Nervous and Auditory   Eustachian tube dysfunction, bilateral      Completed steroid x 5 days and has taken flonase, claritin and mucinex without relief of symptoms.  Swishing noises in left ear x 1 year and bilateral high pitch noises x 6 months.  Will refer to ENT for further evaluation.  Plan: 1. Referral placed to ENT        Other   Numbness and tingling of left leg    History of fall x 1 year ago onto left side.  Continued intermittent left lower back pain with constant radiculopathy of pain, numbness and tingling down the left leg through the left foot.  Reports entire foot numb and some difficulty with ambulation at times.  Minimal improvement with 5 day Prednisone trial.  Will refer to Orthopedics.  Plan: 1. Referral to Orthopedics.      Relevant Orders   AMB referral to orthopedics   Tinnitus - Primary   Relevant Orders   Ambulatory referral to ENT   Immunization due    Due for TDAP vaccination.  Agreeable to vaccination today.  Plan: 1. TDAP Vaccine given in clinic today.      Relevant Orders   Tdap vaccine greater than or equal to 7yo IM (Completed)    Other Visit Diagnoses    Wheezing       Relevant Orders   DG Chest 2  View      No orders of the defined types were placed in this encounter.     Follow up plan: Return in about 4 weeks (around 01/17/2020) for PAP testing/Well Woman Exam.   Harlin Rain, [place] [facility] Nurse Practitioner [facility] 12/20/2019, 10:25 AM

## 2019-12-20 NOTE — Assessment & Plan Note (Signed)
Completed steroid x 5 days and has taken flonase, claritin and mucinex without relief of symptoms.  Swishing noises in left ear x 1 year and bilateral high pitch noises x 6 months.  Will refer to ENT for further evaluation.  Plan: 1. Referral placed to ENT

## 2019-12-20 NOTE — Patient Instructions (Addendum)
I have put in an order for a chest xray for your right lower lobe wheezing.  Please have this completed today and I will be in touch once we receive the results.  For Mammogram screening for breast cancer   Call the [place] below anytime to schedule your own appointment now that order has been placed.  [facility] [place], [place] [address] Phone: [phone]  [facility] [address] [place], [place] [address] Phone: [phone]   I have put in referrals for Ear Nose and Throat and for Orthopedics.  If you have not heard from them in 1 week, please let me know and I will follow up with the referral coordinator.  We will plan to see you back in 4-6 weeks for well woman check and PAP testing  You will receive a survey after today's visit either digitally by e-mail or paper by [place] mail. Your experiences and feedback matter to [place].  Please respond so we know how we are doing as we provide care for you.  Call us with any questions/concerns/needs.  It is my goal to be available to you for your health concerns.  Thanks for choosing me to be a partner in your healthcare needs!  Harlin Rain, FNP-C Family Nurse Practitioner [facility] Phone: [phone]

## 2019-12-23 ENCOUNTER — Ambulatory Visit (INDEPENDENT_AMBULATORY_CARE_PROVIDER_SITE_OTHER): Payer: Medicaid Other | Admitting: Pulmonary Disease

## 2019-12-23 ENCOUNTER — Encounter: Payer: Self-pay | Admitting: Pulmonary Disease

## 2019-12-23 ENCOUNTER — Other Ambulatory Visit: Payer: Self-pay

## 2019-12-23 VITALS — BP 130/90 | HR 77 | Temp 98.0°F | Ht 66.0 in | Wt 164.0 lb

## 2019-12-23 DIAGNOSIS — F17210 Nicotine dependence, cigarettes, uncomplicated: Secondary | ICD-10-CM

## 2019-12-23 DIAGNOSIS — J984 Other disorders of lung: Secondary | ICD-10-CM | POA: Diagnosis not present

## 2019-12-23 DIAGNOSIS — J45909 Unspecified asthma, uncomplicated: Secondary | ICD-10-CM | POA: Diagnosis not present

## 2019-12-23 MED ORDER — BREO ELLIPTA 100-25 MCG/INH IN AEPB: 1.0000 | INHALATION_SPRAY | Freq: Every day | RESPIRATORY_TRACT | 0 refills | Status: DC

## 2019-12-23 MED ORDER — BREO ELLIPTA 100-25 MCG/INH IN AEPB: 1.0000 | INHALATION_SPRAY | Freq: Every day | RESPIRATORY_TRACT | 11 refills | Status: DC

## 2019-12-23 NOTE — Progress Notes (Signed)
Subjective:    Patient ID: Tiffany Benton, female    DOB: Jan 18, 1971, 49 y.o.   MRN: [id number]  HPI Tiffany Benton is a 49 year old current smoker who presents for evaluation of an abnormal chest x-ray.  She is kindly referred by Cyndia Skeeters FNP.  She was establishing care with a new primary care practitioner and in the process wheezing was noted.  Chest x-ray was obtained which showed "subtle biapical pleural-parenchymal scarring".  I have independently reviewed the film.  The film does show some mild hyperinflation and very minimal, if at all, pleural-parenchymal scarring in the apices.  The patient has had mostly complaints of ear congestion and popping.  She has noted to have eustachian tube dysfunction and is not responding to conservative management. She has been referred to ENT.  She also complains of issues with her lower extremities and her back which for which she is being referred to orthopedics.  She has not had any fevers, chills or sweats, she has chronic cough in the mornings which is productive only of clear sputum but not throughout the course of the day.  She does have issues with gastroesophageal reflux which are quite significant.  No orthopnea, paroxysmal nocturnal dyspnea, calf tenderness.  She does have occasional lower extremity edema.  This has been of longstanding. She voices no other complaint.  She has not had any weight loss or anorexia.  Review of Systems A 10 point review of systems was performed and it is as noted above otherwise negative.   No Known Allergies  Immunization History  Administered Date(s) Administered  . Tdap 12/20/2019   Current Meds  Medication Sig  . acetaminophen (TYLENOL) 500 MG tablet Take 500 mg by mouth every 6 (six) hours as needed.  . Aspirin-Salicylamide-Caffeine (BC FAST PAIN RELIEF) 650-195-33.3 MG PACK Take by mouth.  . calcium carbonate (TUMS - DOSED IN MG ELEMENTAL CALCIUM) 500 MG chewable tablet Chew 1 tablet by mouth  daily as needed for indigestion or heartburn.  . fluticasone (FLONASE) 50 MCG/ACT nasal spray Place 2 sprays into both nostrils daily.  Marland Kitchen loratadine (CLARITIN) 10 MG tablet Take 1 tablet (10 mg total) by mouth daily.   Past Medical History:  Diagnosis Date  . Allergy   . Frequent headaches   . Migraine    Past Surgical History:  Procedure Laterality Date  . BREAST CYST ASPIRATION Left 2016   per pt  . TUBAL LIGATION  06/29/07   Family History  Problem Relation Age of Onset  . Breast cancer Mother 34       metastatic  . Bone cancer Maternal Grandfather    Social History   Tobacco Use  . Smoking status: Current Every Day Smoker    Packs/day: 1.00    Years: 25.00    Pack years: 25.00    Types: Cigarettes  . Smokeless tobacco: Never Used  Substance Use Topics  . Alcohol use: Not Currently    Alcohol/week: 0.0 standard drinks    Objective:   Physical Exam BP 130/90 (BP Location: Left Arm, Patient Position: Sitting, Cuff Size: Normal)   Pulse 77   Temp 98 F (36.7 C) (Temporal)   Ht 5\' 6"  (1.676 m)   Wt 164 lb (74.4 kg)   LMP 12/19/2019   SpO2 96%   BMI 26.47 kg/m  GENERAL: Well-developed well-nourished woman, no acute distress.  She is fully ambulatory. HEAD: Normocephalic, atraumatic.  EYES: Pupils equal, round, reactive to light.  No scleral icterus.  MOUTH:  Nose/mouth/throat not examined due to masking requirements for COVID 19. NECK: Supple. No thyromegaly. Trachea midline. No JVD.  No adenopathy. PULMONARY: Good air entry bilaterally.  She has end expiratory wheezes scattered throughout. CARDIOVASCULAR: S1 and S2. Regular rate and rhythm.  No rubs murmurs gallops heard. GASTROINTESTINAL: Benign. MUSCULOSKELETAL: No joint deformity, no clubbing, no edema.  NEUROLOGIC: Awake, alert, oriented, no focal deficits noted.  Speech is fluent. SKIN: Intact,warm,dry.  Limited exam shows no rashes. PSYCH: Mood and behavior appropriate.  Chest x-ray obtained 20 Dec 2019, independently reviewed, as below:     Assessment & Plan:     ICD-10-CM   1. Asthmatic bronchitis without complication, unspecified asthma severity, unspecified whether persistent  J45.909 Pulmonary Function Test [facility] Only  2. Scarring of lung  J98.4 Pulmonary Function Test [facility] Only  3. Tobacco dependence due to cigarettes  F17.210 Pulmonary Function Test [facility] Only   Meds ordered this encounter  Medications  . fluticasone furoate-vilanterol (BREO ELLIPTA) 100-25 MCG/INH AEPB    Sig: Inhale 1 puff into the lungs daily.    Dispense:  28 each    Refill:  0    Order Specific Question:   Lot Number?    Answer:   [id number]    Order Specific Question:   Expiration Date?    Answer:   06/07/2020    Order Specific Question:   Manufacturer?    Answer:   [employer] [[id number]]    Order Specific Question:   Quantity    Answer:   1  . fluticasone furoate-vilanterol (BREO ELLIPTA) 100-25 MCG/INH AEPB    Sig: Inhale 1 puff into the lungs daily.    Dispense:  1 each    Refill:  11    Discussion:  Patient likely has asthmatic bronchitis, she does show significant hyperinflation on chest x-ray.  The mild biapical pleural-parenchymal scarring is minimal at best.  Suspect this is result from prior infectious/inflammatory process.  This by itself does not require further imaging.  Recommend pulmonary function testing to determine the severity of her obstructive lung disease.  I discussed the clinical impression with the patient and also advised her that this may be for stages of COPD however need PFTs to confirm.  In the interim, we will give her a trial of Breo Ellipta 100/25, 1 inhalation daily.  We will see her in follow-up in 3 months time, she is to contact us prior to that time should any new difficulties arise.   Renold Don, MD [facility]  *This note was dictated using voice recognition software/Dragon.  Despite best efforts to proofread, errors can occur which can change the meaning.   Any change was purely unintentional.

## 2019-12-23 NOTE — Patient Instructions (Signed)
We are going to start you on a trial of Breo Ellipta 1 inhalation daily   We will schedule breathing tests.   We will see you in follow-up in 3 months time call sooner should any new difficulties arise.

## 2019-12-24 DIAGNOSIS — M25841 Other specified joint disorders, right hand: Secondary | ICD-10-CM | POA: Diagnosis not present

## 2019-12-26 DIAGNOSIS — G629 Polyneuropathy, unspecified: Secondary | ICD-10-CM | POA: Diagnosis not present

## 2019-12-26 DIAGNOSIS — M5126 Other intervertebral disc displacement, lumbar region: Secondary | ICD-10-CM | POA: Diagnosis not present

## 2019-12-27 DIAGNOSIS — M25541 Pain in joints of right hand: Secondary | ICD-10-CM | POA: Diagnosis not present

## 2019-12-27 DIAGNOSIS — F17210 Nicotine dependence, cigarettes, uncomplicated: Secondary | ICD-10-CM | POA: Diagnosis not present

## 2019-12-27 DIAGNOSIS — Q279 Congenital malformation of peripheral vascular system, unspecified: Secondary | ICD-10-CM | POA: Diagnosis not present

## 2019-12-27 DIAGNOSIS — M67441 Ganglion, right hand: Secondary | ICD-10-CM | POA: Diagnosis not present

## 2019-12-27 DIAGNOSIS — J45909 Unspecified asthma, uncomplicated: Secondary | ICD-10-CM | POA: Diagnosis not present

## 2019-12-27 DIAGNOSIS — G8918 Other acute postprocedural pain: Secondary | ICD-10-CM | POA: Diagnosis not present

## 2019-12-27 DIAGNOSIS — K219 Gastro-esophageal reflux disease without esophagitis: Secondary | ICD-10-CM | POA: Diagnosis not present

## 2019-12-27 DIAGNOSIS — M25841 Other specified joint disorders, right hand: Secondary | ICD-10-CM | POA: Diagnosis not present

## 2019-12-28 DIAGNOSIS — G5792 Unspecified mononeuropathy of left lower limb: Secondary | ICD-10-CM | POA: Diagnosis not present

## 2019-12-28 DIAGNOSIS — R200 Anesthesia of skin: Secondary | ICD-10-CM | POA: Diagnosis not present

## 2019-12-28 DIAGNOSIS — M79672 Pain in left foot: Secondary | ICD-10-CM | POA: Diagnosis not present

## 2019-12-28 DIAGNOSIS — S92032A Displaced avulsion fracture of tuberosity of left calcaneus, initial encounter for closed fracture: Secondary | ICD-10-CM | POA: Diagnosis not present

## 2019-12-28 DIAGNOSIS — S92062A Displaced intraarticular fracture of left calcaneus, initial encounter for closed fracture: Secondary | ICD-10-CM | POA: Diagnosis not present

## 2019-12-28 DIAGNOSIS — S99912A Unspecified injury of left ankle, initial encounter: Secondary | ICD-10-CM | POA: Diagnosis not present

## 2019-12-28 DIAGNOSIS — R2689 Other abnormalities of gait and mobility: Secondary | ICD-10-CM | POA: Diagnosis not present

## 2019-12-28 DIAGNOSIS — S99922A Unspecified injury of left foot, initial encounter: Secondary | ICD-10-CM | POA: Diagnosis not present

## 2020-01-10 ENCOUNTER — Encounter: Payer: Self-pay | Admitting: Family Medicine

## 2020-01-10 ENCOUNTER — Other Ambulatory Visit (HOSPITAL_COMMUNITY)
Admission: RE | Admit: 2020-01-10 | Discharge: 2020-01-10 | Disposition: A | Discharge status: Home / Self Care | Payer: Medicaid Other | Source: Ambulatory Visit | Attending: Family Medicine | Admitting: Family Medicine

## 2020-01-10 ENCOUNTER — Ambulatory Visit (INDEPENDENT_AMBULATORY_CARE_PROVIDER_SITE_OTHER): Payer: Medicaid Other | Admitting: Family Medicine

## 2020-01-10 ENCOUNTER — Other Ambulatory Visit: Payer: Self-pay

## 2020-01-10 VITALS — BP 139/46 | HR 83 | Temp 97.5°F | Ht 66.0 in | Wt 163.0 lb

## 2020-01-10 DIAGNOSIS — R3129 Other microscopic hematuria: Secondary | ICD-10-CM

## 2020-01-10 DIAGNOSIS — D72820 Lymphocytosis (symptomatic): Secondary | ICD-10-CM

## 2020-01-10 DIAGNOSIS — R202 Paresthesia of skin: Secondary | ICD-10-CM | POA: Diagnosis not present

## 2020-01-10 DIAGNOSIS — R200 Anesthesia of skin: Secondary | ICD-10-CM | POA: Diagnosis not present

## 2020-01-10 DIAGNOSIS — Z124 Encounter for screening for malignant neoplasm of cervix: Secondary | ICD-10-CM | POA: Diagnosis not present

## 2020-01-10 DIAGNOSIS — Z0000 Encounter for general adult medical examination without abnormal findings: Secondary | ICD-10-CM | POA: Insufficient documentation

## 2020-01-10 LAB — POCT URINALYSIS DIPSTICK
Bilirubin, UA: NEGATIVE
Glucose, UA: NEGATIVE
Ketones, UA: NEGATIVE
Leukocytes, UA: NEGATIVE
Nitrite, UA: NEGATIVE
Protein, UA: NEGATIVE
Spec Grav, UA: 1.02
Urobilinogen, UA: 0.2 U/dL
pH, UA: 5

## 2020-01-10 LAB — RESULTS CONSOLE HPV: CHL HPV: NEGATIVE

## 2020-01-10 NOTE — Assessment & Plan Note (Signed)
Following with Dr. Sharyne Richters with [facility].  Has recently had a left foot fracture x 2 weeks ago due to numbness/tingling and not feeling the foot with ambulation.  Plan: 1. Continue follow up with Dr. Sharyne Richters with [facility]

## 2020-01-10 NOTE — Assessment & Plan Note (Signed)
Annual physical exam without new findings.  Well adult with no acute concerns.  Plan: 1. Obtain health maintenance screenings as above according to age. - Increase physical activity to 30 minutes most days of the week.  - Eat healthy diet high in vegetables and fruits; low in refined carbohydrates. - Screening labs and tests as ordered 2. Return 1 year for annual physical.  

## 2020-01-10 NOTE — Assessment & Plan Note (Signed)
Due for cervical cancer screening.  PAP due today.  Plan: 1. PAP testing completed today and sent to lab for processing

## 2020-01-10 NOTE — Patient Instructions (Addendum)
We have sent your PAP to the lab for testing.  Will contact you once the results are received.  Have your repeat lab work done today and we will contact you with the results.  Keep your scheduled follow up visit with [facility] and schedule your Ear, Nose and Throat appointment for initial new patient evaluation.  Schedule your mammogram.  For Mammogram screening for breast cancer   Call the [place] below anytime to schedule your own appointment now that order has been placed.  [facility] [place], [place] [address] Phone: [phone]  [facility] [address] [place], [place] [address] Phone: [phone]  Well Visit, Ages 80 to 102: Care Instructions Overview  Well visits can help you stay healthy. Your provider has checked your overall health and may have suggested ways to take good care of yourself. Your provider also may have recommended tests. At home, you can help prevent illness with healthy eating, regular exercise, and other steps.  Follow-up care is a key part of your treatment and safety. Be sure to make and go to all appointments, and call your provider if you are having problems. It's also a good idea to know your test results and keep a list of the medicines you take.  How can you care for yourself at home?   Get screening tests that you and your doctor decide on. Screening helps find diseases before any symptoms appear.   Eat healthy foods. Choose fruits, vegetables, whole grains, protein, and low-fat dairy foods. Limit fat, especially saturated fat. Reduce salt in your diet.   Limit alcohol. If you are a man, have no more than 2 drinks a day or 14 drinks a week. If you are a woman, have no more than 1 drink a day or 7 drinks a week.   Get at least 30 minutes of physical activity on most days of the week.  We recommend you go no more than 2 days in a row without  exercise. Walking is a good choice. You also may want to do other activities, such as running, swimming, cycling, or playing tennis or team sports. Discuss any changes in your exercise program with your provider.   Reach and stay at a healthy weight. This will lower your risk for many problems, such as obesity, diabetes, heart disease, and high blood pressure.   Do not smoke or allow others to smoke around you. If you need help quitting, talk to your provider about stop-smoking programs and medicines. These can increase your chances of quitting for good.  Can call 1-800-QUIT-NOW 212-822-0378) for the [facility], assistance with smoking cessation.   Care for your mental health. It is easy to get weighed down by worry and stress. Learn strategies to manage stress, like deep breathing and mindfulness, and stay connected with your family and community. If you find you often feel sad or hopeless, talk with your provider. Treatment can help.   Talk to your provider about whether you have any risk factors for sexually transmitted infections (STIs). You can help prevent STIs if you wait to have sex with a new partner (or partners) until you've each been tested for STIs. It also helps if you use condoms (female or female condoms) and if you limit your sex partners to one person who only has sex with you. Vaccines are available for some STIs, such as HPV (these are age dependent).   Use  birth control if it's important to you to prevent pregnancy. Talk with your provider about the choices available and what might be best for you.   If you think you may have a problem with alcohol or drug use, talk to your provider. This includes prescription medicines (such as amphetamines and opioids) and illegal drugs (such as cocaine and methamphetamine). Your provider can help you figure out what type of treatment is best for you.   If you have concerns about domestic violence or intimate partner  violence, there are resources available to you. National Domestic Abuse Hotline (934)010-3240   Protect your skin from too much sun. When you're outdoors from 10 a.m. to 4 p.m., stay in the shade or cover up with clothing and a hat with a wide brim. Wear sunglasses that block UV rays. Even when it's cloudy, put broad-spectrum sunscreen (SPF 30 or higher) on any exposed skin.   See a dentist one or two times a year for checkups and to have your teeth cleaned.   See an eye doctor once per year for an eye exam.   Wear a seat belt in the car.  When should you call for help?  Watch closely for changes in your health, and be sure to contact your provider if you have any problems or symptoms that concern you.  Sleep hygiene is the single most effective treatment for sleep issues, but it is hard work.  Tips for a good night's sleep:  -Keep sleep environment comfortable and conducive to sleep -Keep regular sleep schedule 7 nights a week -Avoiding naps during the day -Avoiding going to bed until drowsy and ready to sleep, not trying to sleep, and not watching the clock -Get out of bed if not asleep within 15-20 minutes and returning only when drowsy -Avoiding caffeine, nicotine, alcohol, and other substances that interfere with sleep before bedtime -Take an hour before your set bedtime and start to wind down: bath/shower, no more TV or phone (the blue light can interfere with sleeping), listen to soothing music, or meditation -No TV in your bedroom -Exercising regularly, at least 6 hours before sleep. Yoga and Tai Chi can improve sleep quality  There are a lot of books and apps that may help guide you with any of the following:   -Progressive muscle relaxation (involves methodical tension and relaxation of different Muscle groups throughout body)  Guided imagery  -YouTube - Gwynne Edinger has free videos on YouTube that can help with meditation and some   Abdominal breathing   Over  the counter sleep aid one hour before bed- and gradually wean your use over 2-4 weeks  Some examples are : *Melatonin 5-10 mg *Sleepology (Can find on [employer]) taken according to packaging directions  There are a few online evidence based online programs, unfortunately they are not free.   Developed by a sleep expert who created a drug-free program for insomnia proven more effective than sleeping pills.  www.cbtforinsomnia.com Sleepio is an evidence-based digital sleep improvement program   www.sleepio.com SHUTi is designed to actively help retrain your body and mind for great sleep through six engaging Cognitive Behavioral Therapy for Insomnia strategy and learning sessions  [email or web address]     We will plan to see you back in 1 year for your next physical  You will receive a survey after today's visit either digitally by e-mail or paper by [employer]. Your experiences and feedback matter to [place].  Please respond so we know how we are doing  as we provide care for you.  Call us with any questions/concerns/needs.  It is my goal to be available to you for your health concerns.  Thanks for choosing me to be a partner in your healthcare needs!  Harlin Rain, FNP-C Family Nurse Practitioner [facility] Phone: [phone]

## 2020-01-10 NOTE — Progress Notes (Signed)
Subjective:    Patient ID: Tiffany Benton, female    DOB: September 06, 1970, 49 y.o.   MRN: [id number]  Tiffany Benton is a 49 y.o. female presenting on 01/10/2020 for Annual Exam   HPI   HEALTH MAINTENANCE:  Weight/BMI: Overweight, BMI 26.31 Physical activity: Stays active Diet: Regular Seatbelt: Yes Sunscreen: Yes Mammogram: Due, has order PAP: Completed today Optometry: As needed, due for visit Dentistry: As needed, due for visit  IMMUNIZATIONS: Influenza: Due next season Tetanus: Up to date, 12/20/2019 COVID: Discussed  STOP BANG SCREENING Snoring: Do you snore loudly? yes/no: Yes Tired: Do you often feel tired, fatigued, sleeping during the daytime? yes/no: Yes Observed: Has anyone observed you stop breathing or choking/gasping during your sleep? yes/no: No Pressure: Do you have or being treated for high blood pressure? yes/no: No BMI: Greater than 35? yes/no: No Age: Older than 50? yes/no: No Neck Side: Greater than 17 (males)/16 (females)? yes/no: No Gender: Born as female gender? yes/no: No  Depression screen [facility] 2/9 12/13/2019  Decreased Interest 0  Down, Depressed, Hopeless 0  PHQ - 2 Score 0    Past Medical History:  Diagnosis Date  . Allergy   . Frequent headaches   . Migraine    Past Surgical History:  Procedure Laterality Date  . BREAST CYST ASPIRATION Left 2016   per pt  . TUBAL LIGATION  06/29/07   Social History   Socioeconomic History  . Marital status: Divorced    Spouse name: Not on file  . Number of children: Not on file  . Years of education: Not on file  . Highest education level: Not on file  Occupational History  . Not on file  Tobacco Use  . Smoking status: Current Every Day Smoker    Packs/day: 1.00    Years: 25.00    Pack years: 25.00    Types: Cigarettes  . Smokeless tobacco: Never Used  Substance and Sexual Activity  . Alcohol use: Not Currently    Alcohol/week: 0.0 standard drinks  . Drug use: No  .  Sexual activity: Not on file  Other Topics Concern  . Not on file  Social History Narrative  . Not on file   Social Determinants of Health   Financial Resource Strain:   . Difficulty of Paying Living Expenses:   Food Insecurity:   . Worried About Charity fundraiser in the Last Year:   . Arboriculturist in the Last Year:   Transportation Needs:   . Film/video editor (Medical):   Marland Kitchen Lack of Transportation (Non-Medical):   Physical Activity:   . Days of Exercise per Week:   . Minutes of Exercise per Session:   Stress:   . Feeling of Stress :   Social Connections:   . Frequency of Communication with Friends and Family:   . Frequency of Social Gatherings with Friends and Family:   . Attends Religious Services:   . Active Member of Clubs or Organizations:   . Attends Archivist Meetings:   Marland Kitchen Marital Status:   Intimate Partner Violence:   . Fear of Current or Ex-Partner:   . Emotionally Abused:   Marland Kitchen Physically Abused:   . Sexually Abused:    Family History  Problem Relation Age of Onset  . Breast cancer Mother 38       metastatic  . Bone cancer Maternal Grandfather    Current Outpatient Medications on File Prior to Visit  Medication Sig  .  Aspirin-Salicylamide-Caffeine (BC FAST PAIN RELIEF) [phone] MG PACK Take by mouth.  . calcium carbonate (TUMS - DOSED IN MG ELEMENTAL CALCIUM) 500 MG chewable tablet Chew 1 tablet by mouth daily as needed for indigestion or heartburn.  . fluticasone (FLONASE) 50 MCG/ACT nasal spray Place 2 sprays into both nostrils daily.  . fluticasone furoate-vilanterol (BREO ELLIPTA) 100-25 MCG/INH AEPB Inhale 1 puff into the lungs daily.  Marland Kitchen gabapentin (NEURONTIN) 300 MG capsule Take 600 mg by mouth at bedtime.   Marland Kitchen ibuprofen (ADVIL) 600 MG tablet ibuprofen 600 mg tablet  TAKE 1 TABLET BY MOUTH THREE TIMES DAILY  . loratadine (CLARITIN) 10 MG tablet Take 1 tablet (10 mg total) by mouth daily.  . meloxicam (MOBIC) 15 MG tablet  meloxicam 15 mg tablet  TAKE 1 TABLET BY MOUTH EVERY DAY  . acetaminophen (TYLENOL) 500 MG tablet Take 500 mg by mouth every 6 (six) hours as needed.  . fluticasone furoate-vilanterol (BREO ELLIPTA) 100-25 MCG/INH AEPB Inhale 1 puff into the lungs daily. (Patient not taking: Reported on 01/10/2020)  . ondansetron (ZOFRAN-ODT) 4 MG disintegrating tablet ondansetron 4 mg disintegrating tablet  DISSOLVE 1 TABLET ON THE TONGUE EVERY 12 HOURS AS NEEDED FOR NAUSEA   No current facility-administered medications on file prior to visit.    Per HPI unless specifically indicated above     Objective:    BP (!) 139/46 (BP Location: Left Arm, Patient Position: Sitting, Cuff Size: Normal)   Pulse 83   Temp (!) 97.5 F (36.4 C) (Temporal)   Ht 5\' 6"  (1.676 m)   Wt 163 lb (73.9 kg)   LMP 12/19/2019   BMI 26.31 kg/m   Wt Readings from Last 3 Encounters:  01/10/20 163 lb (73.9 kg)  12/23/19 164 lb (74.4 kg)  12/20/19 165 lb 12.8 oz (75.2 kg)    Physical Exam Vitals reviewed.  Constitutional:      General: She is not in acute distress.    Appearance: Normal appearance. She is well-developed, well-groomed and overweight. She is not ill-appearing or toxic-appearing.  HENT:     Head: Normocephalic.     Right Ear: Tympanic membrane, ear canal and external ear normal. There is no impacted cerumen.     Left Ear: Tympanic membrane, ear canal and external ear normal. There is no impacted cerumen.     Nose: Nose normal. No congestion or rhinorrhea.     Mouth/Throat:     Lips: Pink.     Mouth: Mucous membranes are moist.     Pharynx: Oropharynx is clear. Uvula midline. No oropharyngeal exudate or posterior oropharyngeal erythema.  Eyes:     General: Lids are normal. Vision grossly intact. No scleral icterus.       Right eye: No discharge.        Left eye: No discharge.     Extraocular Movements: Extraocular movements intact.     Conjunctiva/sclera: Conjunctivae normal.     Pupils: Pupils are equal,  round, and reactive to light.  Neck:     Thyroid: No thyroid mass or thyromegaly.  Cardiovascular:     Rate and Rhythm: Normal rate and regular rhythm.     Pulses: Normal pulses.          Dorsalis pedis pulses are 2+ on the right side and 2+ on the left side.     Heart sounds: Normal heart sounds. No murmur. No friction rub. No gallop.   Pulmonary:     Effort: Pulmonary effort is normal. No respiratory distress.  Breath sounds: Normal breath sounds.  Abdominal:     General: Abdomen is flat. Bowel sounds are normal. There is no distension.     Palpations: Abdomen is soft. There is no hepatomegaly, splenomegaly or mass.     Tenderness: There is no abdominal tenderness. There is no guarding or rebound.     Hernia: No hernia is present. There is no hernia in the left inguinal area or right inguinal area.  Genitourinary:    General: Normal vulva.     Exam position: Lithotomy position.     Pubic Area: No rash or pubic lice.      Labia:        Right: No rash, tenderness, lesion or injury.        Left: No rash, tenderness, lesion or injury.      Urethra: No urethral pain, urethral swelling or urethral lesion.     Vagina: Normal. No signs of injury and foreign body. No vaginal discharge, erythema, tenderness, bleeding or lesions.     Cervix: No cervical motion tenderness, discharge, friability, lesion, erythema, cervical bleeding or eversion.     Uterus: Normal. Not enlarged and not tender.      Adnexa: Right adnexa normal and left adnexa normal.  Musculoskeletal:        General: Normal range of motion.     Cervical back: Normal range of motion and neck supple. No tenderness.     Right lower leg: No edema.     Left lower leg: No edema.     Comments: Normal tone, BUE & BLE with 5/5 strength  Feet:     Comments: Left foot in walking boot with current ecchymosis throughout.  Has reported left foot fracture x 2 weeks, following with [facility] in [place] Lymphadenopathy:      Cervical: No cervical adenopathy.     Lower Body: No right inguinal adenopathy. No left inguinal adenopathy.  Skin:    General: Skin is warm and dry.     Capillary Refill: Capillary refill takes less than 2 seconds.  Neurological:     General: No focal deficit present.     Mental Status: She is alert and oriented to person, place, and time.     Cranial Nerves: No cranial nerve deficit.     Sensory: No sensory deficit.     Motor: No weakness.     Coordination: Coordination normal.     Gait: Gait normal.     Deep Tendon Reflexes: Reflexes normal.  Psychiatric:        Attention and Perception: Attention and perception normal.        Mood and Affect: Mood and affect normal.        Speech: Speech normal.        Behavior: Behavior normal. Behavior is cooperative.        Thought Content: Thought content normal.        Cognition and Memory: Cognition and memory normal.        Judgment: Judgment normal.     Results for orders placed or performed in visit on 01/10/20  POCT Urinalysis Dipstick  Result Value Ref Range   Color, UA yellow    Clarity, UA clear    Glucose, UA Negative Negative   Bilirubin, UA negative    Ketones, UA negative    Spec Grav, UA 1.020 1.010 - 1.025   Blood, UA large    pH, UA 5.0 5.0 - 8.0   Protein, UA Negative Negative  Urobilinogen, UA 0.2 0.2 or 1.0 E.U./dL   Nitrite, UA negative    Leukocytes, UA Negative Negative   Appearance     Odor        Assessment & Plan:   Problem List Items Addressed This Visit      Other   Numbness and tingling of left leg    Following with Dr. Sharyne Richters with [facility].  Has recently had a left foot fracture x 2 weeks ago due to numbness/tingling and not feeling the foot with ambulation.  Plan: 1. Continue follow up with Dr. Sharyne Richters with [facility]      Routine medical exam    Annual physical exam without new findings.  Well adult with no acute concerns.  Plan: 1. Obtain health maintenance screenings as above  according to age. - Increase physical activity to 30 minutes most days of the week.  - Eat healthy diet high in vegetables and fruits; low in refined carbohydrates. - Screening labs and tests as ordered 2. Return 1 year for annual physical.       Screening for cervical cancer    Due for cervical cancer screening.  PAP due today.  Plan: 1. PAP testing completed today and sent to lab for processing      Relevant Orders   Cytology - PAP    Other Visit Diagnoses    Annual physical exam    -  Primary   Relevant Orders   POCT Urinalysis Dipstick (Completed)   Microscopic hematuria       Relevant Orders   Urinalysis, microscopic only   Lymphocytosis          No orders of the defined types were placed in this encounter.     Follow up plan: Return in about 1 year (around 01/09/2021) for CPE.  Harlin Rain, FNP-C Family Nurse Practitioner [facility] 01/10/2020, 12:19 PM

## 2020-01-11 LAB — URINALYSIS, MICROSCOPIC ONLY: Hyaline Cast: NONE SEEN /LPF

## 2020-01-11 LAB — CBC WITH DIFFERENTIAL/PLATELET
Absolute Monocytes: 612 cells/uL (ref 200–950)
Basophils Absolute: 71 cells/uL (ref 0–200)
Basophils Relative: 0.7 %
Eosinophils Absolute: 143 cells/uL (ref 15–500)
Eosinophils Relative: 1.4 %
HCT: 42.2 % (ref 35.0–45.0)
Hemoglobin: 14 g/dL (ref 11.7–15.5)
Lymphs Abs: 2407 cells/uL (ref 850–3900)
MCH: 32.3 pg (ref 27.0–33.0)
MCHC: 33.2 g/dL (ref 32.0–36.0)
MCV: 97.5 fL (ref 80.0–100.0)
MPV: 11.6 fL (ref 7.5–12.5)
Monocytes Relative: 6 %
Neutro Abs: 6967 cells/uL (ref 1500–7800)
Neutrophils Relative %: 68.3 %
Platelets: 199 10*3/uL (ref 140–400)
RBC: 4.33 10*6/uL (ref 3.80–5.10)
RDW: 12.2 % (ref 11.0–15.0)
Total Lymphocyte: 23.6 %
WBC: 10.2 10*3/uL (ref 3.8–10.8)

## 2020-01-15 LAB — CYTOLOGY - PAP
Diagnosis: NEGATIVE
Diagnosis: REACTIVE

## 2020-01-16 DIAGNOSIS — M25572 Pain in left ankle and joints of left foot: Secondary | ICD-10-CM | POA: Diagnosis not present

## 2020-01-16 DIAGNOSIS — M5126 Other intervertebral disc displacement, lumbar region: Secondary | ICD-10-CM | POA: Diagnosis not present

## 2020-02-06 DIAGNOSIS — Z419 Encounter for procedure for purposes other than remedying health state, unspecified: Secondary | ICD-10-CM | POA: Diagnosis not present

## 2020-02-10 DIAGNOSIS — M5136 Other intervertebral disc degeneration, lumbar region: Secondary | ICD-10-CM | POA: Diagnosis not present

## 2020-02-13 DIAGNOSIS — M5416 Radiculopathy, lumbar region: Secondary | ICD-10-CM | POA: Diagnosis not present

## 2020-02-13 DIAGNOSIS — M25572 Pain in left ankle and joints of left foot: Secondary | ICD-10-CM | POA: Diagnosis not present

## 2020-02-21 ENCOUNTER — Telehealth: Payer: Self-pay

## 2020-02-21 NOTE — Telephone Encounter (Signed)
Noted  

## 2020-02-21 NOTE — Telephone Encounter (Signed)
Lm to relay date/time of covid test prior to PFT.  [facility]

## 2020-02-24 ENCOUNTER — Other Ambulatory Visit: Payer: Medicaid Other

## 2020-02-25 ENCOUNTER — Ambulatory Visit: Payer: Medicaid Other | Attending: Pulmonary Disease

## 2020-02-26 DIAGNOSIS — M5416 Radiculopathy, lumbar region: Secondary | ICD-10-CM | POA: Diagnosis not present

## 2020-03-08 DIAGNOSIS — Z419 Encounter for procedure for purposes other than remedying health state, unspecified: Secondary | ICD-10-CM | POA: Diagnosis not present

## 2020-03-12 DIAGNOSIS — M5416 Radiculopathy, lumbar region: Secondary | ICD-10-CM | POA: Diagnosis not present

## 2020-03-12 DIAGNOSIS — M8588 Other specified disorders of bone density and structure, other site: Secondary | ICD-10-CM | POA: Diagnosis not present

## 2020-03-26 DIAGNOSIS — M5126 Other intervertebral disc displacement, lumbar region: Secondary | ICD-10-CM | POA: Diagnosis not present

## 2020-04-08 DIAGNOSIS — Z419 Encounter for procedure for purposes other than remedying health state, unspecified: Secondary | ICD-10-CM | POA: Diagnosis not present

## 2020-05-08 DIAGNOSIS — Z419 Encounter for procedure for purposes other than remedying health state, unspecified: Secondary | ICD-10-CM | POA: Diagnosis not present

## 2020-06-08 DIAGNOSIS — Z419 Encounter for procedure for purposes other than remedying health state, unspecified: Secondary | ICD-10-CM | POA: Diagnosis not present

## 2020-06-08 HISTORY — PX: BACK SURGERY: SHX140

## 2020-06-23 DIAGNOSIS — G629 Polyneuropathy, unspecified: Secondary | ICD-10-CM | POA: Diagnosis not present

## 2020-06-23 DIAGNOSIS — E7800 Pure hypercholesterolemia, unspecified: Secondary | ICD-10-CM | POA: Diagnosis not present

## 2020-06-23 DIAGNOSIS — F17210 Nicotine dependence, cigarettes, uncomplicated: Secondary | ICD-10-CM | POA: Diagnosis not present

## 2020-06-23 DIAGNOSIS — M48061 Spinal stenosis, lumbar region without neurogenic claudication: Secondary | ICD-10-CM | POA: Diagnosis not present

## 2020-06-23 DIAGNOSIS — K219 Gastro-esophageal reflux disease without esophagitis: Secondary | ICD-10-CM | POA: Diagnosis not present

## 2020-06-23 DIAGNOSIS — F419 Anxiety disorder, unspecified: Secondary | ICD-10-CM | POA: Diagnosis not present

## 2020-06-23 DIAGNOSIS — M5116 Intervertebral disc disorders with radiculopathy, lumbar region: Secondary | ICD-10-CM | POA: Diagnosis not present

## 2020-06-23 DIAGNOSIS — J45909 Unspecified asthma, uncomplicated: Secondary | ICD-10-CM | POA: Diagnosis not present

## 2020-07-08 DIAGNOSIS — Z419 Encounter for procedure for purposes other than remedying health state, unspecified: Secondary | ICD-10-CM | POA: Diagnosis not present

## 2020-08-08 DIAGNOSIS — Z419 Encounter for procedure for purposes other than remedying health state, unspecified: Secondary | ICD-10-CM | POA: Diagnosis not present

## 2020-09-08 DIAGNOSIS — Z419 Encounter for procedure for purposes other than remedying health state, unspecified: Secondary | ICD-10-CM | POA: Diagnosis not present

## 2020-10-06 DIAGNOSIS — Z419 Encounter for procedure for purposes other than remedying health state, unspecified: Secondary | ICD-10-CM | POA: Diagnosis not present

## 2020-11-06 DIAGNOSIS — Z419 Encounter for procedure for purposes other than remedying health state, unspecified: Secondary | ICD-10-CM | POA: Diagnosis not present

## 2020-12-06 DIAGNOSIS — Z419 Encounter for procedure for purposes other than remedying health state, unspecified: Secondary | ICD-10-CM | POA: Diagnosis not present

## 2021-01-06 DIAGNOSIS — Z419 Encounter for procedure for purposes other than remedying health state, unspecified: Secondary | ICD-10-CM | POA: Diagnosis not present

## 2021-01-14 ENCOUNTER — Other Ambulatory Visit: Payer: Self-pay

## 2021-01-14 ENCOUNTER — Ambulatory Visit (INDEPENDENT_AMBULATORY_CARE_PROVIDER_SITE_OTHER): Payer: Medicaid Other | Admitting: Internal Medicine

## 2021-01-14 ENCOUNTER — Ambulatory Visit
Admission: RE | Admit: 2021-01-14 | Discharge: 2021-01-14 | Disposition: A | Discharge status: Home / Self Care | Payer: Medicaid Other | Attending: Internal Medicine | Admitting: Internal Medicine

## 2021-01-14 ENCOUNTER — Ambulatory Visit
Admission: RE | Admit: 2021-01-14 | Discharge: 2021-01-14 | Disposition: A | Discharge status: Home / Self Care | Payer: Medicaid Other | Source: Ambulatory Visit | Attending: Internal Medicine | Admitting: Internal Medicine

## 2021-01-14 ENCOUNTER — Encounter: Payer: Self-pay | Admitting: Internal Medicine

## 2021-01-14 VITALS — BP 130/74 | HR 101 | Temp 97.7°F | Resp 17 | Ht 66.0 in | Wt 157.0 lb

## 2021-01-14 DIAGNOSIS — E663 Overweight: Secondary | ICD-10-CM

## 2021-01-14 DIAGNOSIS — Z1211 Encounter for screening for malignant neoplasm of colon: Secondary | ICD-10-CM

## 2021-01-14 DIAGNOSIS — M25512 Pain in left shoulder: Secondary | ICD-10-CM

## 2021-01-14 DIAGNOSIS — G8929 Other chronic pain: Secondary | ICD-10-CM | POA: Diagnosis not present

## 2021-01-14 DIAGNOSIS — Z114 Encounter for screening for human immunodeficiency virus [HIV]: Secondary | ICD-10-CM | POA: Diagnosis not present

## 2021-01-14 DIAGNOSIS — E782 Mixed hyperlipidemia: Secondary | ICD-10-CM | POA: Diagnosis not present

## 2021-01-14 DIAGNOSIS — Z0001 Encounter for general adult medical examination with abnormal findings: Secondary | ICD-10-CM

## 2021-01-14 DIAGNOSIS — M25511 Pain in right shoulder: Secondary | ICD-10-CM | POA: Insufficient documentation

## 2021-01-14 DIAGNOSIS — Z1159 Encounter for screening for other viral diseases: Secondary | ICD-10-CM | POA: Diagnosis not present

## 2021-01-14 NOTE — Progress Notes (Signed)
Subjective:    Patient ID: Tiffany Benton, female    DOB: 07-May-1971, 50 y.o.   MRN: [id number]  HPI  Patient presents the clinic today for her annual exam.  Flu: never Tetanus: 12/2019 Covid: never Shingrix: never Pap Smear: 01/2020 Mammogram: 10/2018 Colon Screening: never Vision Screening: as needed Dentist: biannually  Diet: She does eat meat. She consumes more veggies than fruits. She tries to avoid fried foods. She drinks mostly water, sweet tea, occasional soda. Exercise: None  Review of Systems  Past Medical History:  Diagnosis Date   Allergy    Frequent headaches    Migraine     Current Outpatient Medications  Medication Sig Dispense Refill   acetaminophen (TYLENOL) 500 MG tablet Take 500 mg by mouth every 6 (six) hours as needed.     Aspirin-Salicylamide-Caffeine (BC FAST PAIN RELIEF) 650-195-33.3 MG PACK Take by mouth.     calcium carbonate (TUMS - DOSED IN MG ELEMENTAL CALCIUM) 500 MG chewable tablet Chew 1 tablet by mouth daily as needed for indigestion or heartburn.     fluticasone (FLONASE) 50 MCG/ACT nasal spray Place 2 sprays into both nostrils daily. 16 g 3   fluticasone furoate-vilanterol (BREO ELLIPTA) 100-25 MCG/INH AEPB Inhale 1 puff into the lungs daily. 28 each 0   gabapentin (NEURONTIN) 300 MG capsule Take 600 mg by mouth at bedtime.      ibuprofen (ADVIL) 600 MG tablet ibuprofen 600 mg tablet  TAKE 1 TABLET BY MOUTH THREE TIMES DAILY     loratadine (CLARITIN) 10 MG tablet Take 1 tablet (10 mg total) by mouth daily. 90 tablet 1   meloxicam (MOBIC) 15 MG tablet meloxicam 15 mg tablet  TAKE 1 TABLET BY MOUTH EVERY DAY     ondansetron (ZOFRAN-ODT) 4 MG disintegrating tablet ondansetron 4 mg disintegrating tablet  DISSOLVE 1 TABLET ON THE TONGUE EVERY 12 HOURS AS NEEDED FOR NAUSEA     No current facility-administered medications for this visit.    No Known Allergies  Family History  Problem Relation Age of Onset   Breast cancer Mother  83       metastatic   Bone cancer Maternal Grandfather     Social History   Socioeconomic History   Marital status: Divorced    Spouse name: Not on file   Number of children: Not on file   Years of education: Not on file   Highest education level: Not on file  Occupational History   Not on file  Tobacco Use   Smoking status: Every Day    Packs/day: 1.00    Years: 25.00    Pack years: 25.00    Types: Cigarettes   Smokeless tobacco: Never  Vaping Use   Vaping Use: Never used  Substance and Sexual Activity   Alcohol use: Not Currently    Alcohol/week: 0.0 standard drinks   Drug use: No   Sexual activity: Not on file  Other Topics Concern   Not on file  Social History Narrative   Not on file   Social Determinants of Health   Financial Resource Strain: Not on file  Food Insecurity: Not on file  Transportation Needs: Not on file  Physical Activity: Not on file  Stress: Not on file  Social Connections: Not on file  Intimate Partner Violence: Not on file     Constitutional: Denies fever, malaise, fatigue, headache or abrupt weight changes.  HEENT: Denies eye pain, eye redness, ear pain, ringing in the ears, wax buildup, runny  nose, nasal congestion, bloody nose, or sore throat. Respiratory: Denies difficulty breathing, shortness of breath, cough or sputum production.   Cardiovascular: Denies chest pain, chest tightness, palpitations or swelling in the hands or feet.  Gastrointestinal: Denies abdominal pain, bloating, constipation, diarrhea or blood in the stool.  GU: Denies urgency, frequency, pain with urination, burning sensation, blood in urine, odor or discharge. Musculoskeletal: Patient reports decreased range of motion bilateral shoulders.  Denies difficulty with gait, muscle pain or joint pain and swelling.  Skin: Denies redness, rashes, lesions or ulcercations.  Neurological: Denies dizziness, difficulty with memory, difficulty with speech or problems with  balance and coordination.  Psych: Denies anxiety, depression, SI/HI.  No other specific complaints in a complete review of systems (except as listed in HPI above).     Objective:   Physical Exam  BP 130/74 (BP Location: Left Arm, Patient Position: Sitting, Cuff Size: Normal)   Pulse (!) 101   Temp 97.7 F (36.5 C) (Temporal)   Resp 17   Ht 5' 6"  (1.676 m)   Wt 71.2 kg   LMP  (LMP Unknown)   SpO2 100%   BMI 25.34 kg/m   Wt Readings from Last 3 Encounters:  01/10/20 163 lb (73.9 kg)  12/23/19 164 lb (74.4 kg)  12/20/19 165 lb 12.8 oz (75.2 kg)    General: Appears her stated age, well developed, well nourished in NAD. Skin: Warm, dry and intact. No rashes noted. HEENT: Head: normal shape and size; Eyes: sclera white and EOMs intact;  Neck:  Neck supple, trachea midline. No masses, lumps or thyromegaly present.  Cardiovascular: Tachycardic with rhythm. S1,S2 noted.  No murmur, rubs or gallops noted. No JVD or BLE edema. No carotid bruits noted. Pulmonary/Chest: Normal effort and positive vesicular breath sounds. No respiratory distress. No wheezes, rales or ronchi noted.  Abdomen: Soft and nontender. Normal bowel sounds. No distention or masses noted. Liver, spleen and kidneys non palpable. Musculoskeletal: Decreased internal and external rotation of bilateral shoulders.  No joint swelling noted.  Negative drop can test bilaterally.  Strength 5/5 BUE/BLE.  No difficulty with gait.  Neurological: Alert and oriented. Cranial nerves II-XII grossly intact. Coordination normal.  Psychiatric: Mood and affect normal. Behavior is normal. Judgment and thought content normal.   BMET    Component Value Date/Time   NA 142 12/16/2019 0804   K 3.6 12/16/2019 0804   CL 110 12/16/2019 0804   CO2 24 12/16/2019 0804   GLUCOSE 74 12/16/2019 0804   BUN 18 12/16/2019 0804   CREATININE 0.77 12/16/2019 0804   CALCIUM 8.7 12/16/2019 0804   GFRNONAA 91 12/16/2019 0804   GFRAA 105 12/16/2019 0804     Lipid Panel     Component Value Date/Time   CHOL 220 (H) 12/16/2019 0804   TRIG 93 12/16/2019 0804   HDL 46 (L) 12/16/2019 0804   CHOLHDL 4.8 12/16/2019 0804   LDLCALC 154 (H) 12/16/2019 0804    CBC    Component Value Date/Time   WBC 10.2 01/10/2020 1045   RBC 4.33 01/10/2020 1045   HGB 14.0 01/10/2020 1045   HCT 42.2 01/10/2020 1045   PLT 199 01/10/2020 1045   MCV 97.5 01/10/2020 1045   MCH 32.3 01/10/2020 1045   MCHC 33.2 01/10/2020 1045   RDW 12.2 01/10/2020 1045   LYMPHSABS 2,407 01/10/2020 1045   EOSABS 143 01/10/2020 1045   BASOSABS 71 01/10/2020 1045    Hgb A1C No results found for: HGBA1C  Assessment & Plan:   Preventative Health Maintenance:  Encouraged her to get a flu shot in the fall Tetanus UTD Encouraged her to get her COVID-vaccine She declines shingles vaccine at this time Pap smear UTD Encouraged her to schedule her mammogram Referral to GI for screening colonoscopy Encouraged her to consume a balanced diet and exercise regimen Advised her to see an eye doctor and dentist annually We will check CBC, c-Met, TSH, lipid, A1c, HIV and hep C today  Bilateral Shoulder Pain:  Will obtain x-ray of bilateral shoulders today  We will follow-up after imaging, RTC in 1 year, sooner if needed  Webb Silversmith, NP This visit occurred during the SARS-CoV-2 public health emergency.  Safety protocols were in place, including screening questions prior to the visit, additional usage of staff PPE, and extensive cleaning of exam room while observing appropriate contact time as indicated for disinfecting solutions.

## 2021-01-15 LAB — TSH: TSH: 0.51 mIU/L

## 2021-01-15 LAB — LIPID PANEL
Cholesterol: 293 mg/dL — ABNORMAL HIGH (ref <200)
HDL: 45 mg/dL — ABNORMAL LOW (ref >=50)
LDL Cholesterol (Calc): 209 mg/dL (calc) — ABNORMAL HIGH
Non-HDL Cholesterol (Calc): 248 mg/dL (calc) — ABNORMAL HIGH (ref <130)
Total CHOL/HDL Ratio: 6.5 (calc) — ABNORMAL HIGH (ref <5.0)
Triglycerides: 208 mg/dL — ABNORMAL HIGH (ref <150)

## 2021-01-15 LAB — COMPLETE METABOLIC PANEL WITH GFR
AG Ratio: 1.8 (calc) (ref 1.0–2.5)
ALT: 12 U/L (ref 6–29)
AST: 14 U/L (ref 10–35)
Albumin: 4.6 g/dL (ref 3.6–5.1)
Alkaline phosphatase (APISO): 79 U/L (ref 37–153)
BUN: 15 mg/dL (ref 7–25)
CO2: 24 mmol/L (ref 20–32)
Calcium: 9.7 mg/dL (ref 8.6–10.4)
Chloride: 107 mmol/L (ref 98–110)
Creat: 0.63 mg/dL (ref 0.50–1.05)
GFR, Est African American: 121 mL/min/{1.73_m2} (ref >=60)
GFR, Est Non African American: 105 mL/min/{1.73_m2} (ref >=60)
Globulin: 2.6 g/dL (calc) (ref 1.9–3.7)
Glucose, Bld: 89 mg/dL (ref 65–99)
Potassium: 4.1 mmol/L (ref 3.5–5.3)
Sodium: 139 mmol/L (ref 135–146)
Total Bilirubin: 0.3 mg/dL (ref 0.2–1.2)
Total Protein: 7.2 g/dL (ref 6.1–8.1)

## 2021-01-15 LAB — CBC
HCT: 42 % (ref 35.0–45.0)
Hemoglobin: 14 g/dL (ref 11.7–15.5)
MCH: 32 pg (ref 27.0–33.0)
MCHC: 33.3 g/dL (ref 32.0–36.0)
MCV: 96.1 fL (ref 80.0–100.0)
MPV: 10.7 fL (ref 7.5–12.5)
Platelets: 264 10*3/uL (ref 140–400)
RBC: 4.37 10*6/uL (ref 3.80–5.10)
RDW: 11.8 % (ref 11.0–15.0)
WBC: 12.5 10*3/uL — ABNORMAL HIGH (ref 3.8–10.8)

## 2021-01-15 LAB — HEMOGLOBIN A1C
Hgb A1c MFr Bld: 5.4 % of total Hgb (ref <5.7)
Mean Plasma Glucose: 108 mg/dL
eAG (mmol/L): 6 mmol/L

## 2021-01-15 LAB — HEPATITIS C ANTIBODY
Hepatitis C Ab: NONREACTIVE
SIGNAL TO CUT-OFF: 0 (ref <1.00)

## 2021-01-15 LAB — HIV ANTIBODY (ROUTINE TESTING W REFLEX): HIV 1&2 Ab, 4th Generation: NONREACTIVE

## 2021-01-17 ENCOUNTER — Encounter: Payer: Self-pay | Admitting: Internal Medicine

## 2021-01-17 DIAGNOSIS — E6609 Other obesity due to excess calories: Secondary | ICD-10-CM | POA: Insufficient documentation

## 2021-01-17 DIAGNOSIS — E66811 Obesity, class 1: Secondary | ICD-10-CM | POA: Insufficient documentation

## 2021-01-17 DIAGNOSIS — E663 Overweight: Secondary | ICD-10-CM | POA: Insufficient documentation

## 2021-01-17 DIAGNOSIS — Z6827 Body mass index (BMI) 27.0-27.9, adult: Secondary | ICD-10-CM | POA: Insufficient documentation

## 2021-01-17 NOTE — Patient Instructions (Signed)
Health Maintenance, Female Adopting a healthy lifestyle and getting preventive care are important in promoting health and wellness. Ask your health care provider about: The right schedule for you to have regular tests and exams. Things you can do on your own to prevent diseases and keep yourself healthy. What should I know about diet, weight, and exercise? Eat a healthy diet  Eat a diet that includes plenty of vegetables, fruits, low-fat dairy products, and lean protein. Do not eat a lot of foods that are high in solid fats, added sugars, or sodium.  Maintain a healthy weight Body mass index (BMI) is used to identify weight problems. It estimates body fat based on height and weight. Your health care provider can help determineyour BMI and help you achieve or maintain a healthy weight. Get regular exercise Get regular exercise. This is one of the most important things you can do for your health. Most adults should: Exercise for at least 150 minutes each week. The exercise should increase your heart rate and make you sweat (moderate-intensity exercise). Do strengthening exercises at least twice a week. This is in addition to the moderate-intensity exercise. Spend less time sitting. Even light physical activity can be beneficial. Watch cholesterol and blood lipids Have your blood tested for lipids and cholesterol at 50 years of age, then havethis test every 5 years. Have your cholesterol levels checked more often if: Your lipid or cholesterol levels are high. You are older than 50 years of age. You are at high risk for heart disease. What should I know about cancer screening? Depending on your health history and family history, you may need to have cancer screening at various ages. This may include screening for: Breast cancer. Cervical cancer. Colorectal cancer. Skin cancer. Lung cancer. What should I know about heart disease, diabetes, and high blood pressure? Blood pressure and heart  disease High blood pressure causes heart disease and increases the risk of stroke. This is more likely to develop in people who have high blood pressure readings, are of African descent, or are overweight. Have your blood pressure checked: Every 3-5 years if you are 18-39 years of age. Every year if you are 40 years old or older. Diabetes Have regular diabetes screenings. This checks your fasting blood sugar level. Have the screening done: Once every three years after age 40 if you are at a normal weight and have a low risk for diabetes. More often and at a younger age if you are overweight or have a high risk for diabetes. What should I know about preventing infection? Hepatitis B If you have a higher risk for hepatitis B, you should be screened for this virus. Talk with your health care provider to find out if you are at risk forhepatitis B infection. Hepatitis C Testing is recommended for: Everyone born from 1945 through 1965. Anyone with known risk factors for hepatitis C. Sexually transmitted infections (STIs) Get screened for STIs, including gonorrhea and chlamydia, if: You are sexually active and are younger than 50 years of age. You are older than 50 years of age and your health care provider tells you that you are at risk for this type of infection. Your sexual activity has changed since you were last screened, and you are at increased risk for chlamydia or gonorrhea. Ask your health care provider if you are at risk. Ask your health care provider about whether you are at high risk for HIV. Your health care provider may recommend a prescription medicine to help   prevent HIV infection. If you choose to take medicine to prevent HIV, you should first get tested for HIV. You should then be tested every 3 months for as long as you are taking the medicine. Pregnancy If you are about to stop having your period (premenopausal) and you may become pregnant, seek counseling before you get  pregnant. Take 400 to 800 micrograms (mcg) of folic acid every day if you become pregnant. Ask for birth control (contraception) if you want to prevent pregnancy. Osteoporosis and menopause Osteoporosis is a disease in which the bones lose minerals and strength with aging. This can result in bone fractures. If you are 65 years old or older, or if you are at risk for osteoporosis and fractures, ask your health care provider if you should: Be screened for bone loss. Take a calcium or vitamin D supplement to lower your risk of fractures. Be given hormone replacement therapy (HRT) to treat symptoms of menopause. Follow these instructions at home: Lifestyle Do not use any products that contain nicotine or tobacco, such as cigarettes, e-cigarettes, and chewing tobacco. If you need help quitting, ask your health care provider. Do not use street drugs. Do not share needles. Ask your health care provider for help if you need support or information about quitting drugs. Alcohol use Do not drink alcohol if: Your health care provider tells you not to drink. You are pregnant, may be pregnant, or are planning to become pregnant. If you drink alcohol: Limit how much you use to 0-1 drink a day. Limit intake if you are breastfeeding. Be aware of how much alcohol is in your drink. In the [place] one drink equals one 12 oz bottle of beer (355 mL), one 5 oz glass of wine (148 mL), or one 1 oz glass of hard liquor (44 mL). General instructions Schedule regular health, dental, and eye exams. Stay current with your vaccines. Tell your health care provider if: You often feel depressed. You have ever been abused or do not feel safe at home. Summary Adopting a healthy lifestyle and getting preventive care are important in promoting health and wellness. Follow your health care provider's instructions about healthy diet, exercising, and getting tested or screened for diseases. Follow your health care provider's  instructions on monitoring your cholesterol and blood pressure. This information is not intended to replace advice given to you by your health care provider. Make sure you discuss any questions you have with your healthcare provider. Document Revised: 07/18/2018 Document Reviewed: 07/18/2018 Elsevier Patient Education  2022 [employer]  

## 2021-01-18 ENCOUNTER — Other Ambulatory Visit: Payer: Self-pay

## 2021-01-18 DIAGNOSIS — E782 Mixed hyperlipidemia: Secondary | ICD-10-CM | POA: Insufficient documentation

## 2021-01-18 DIAGNOSIS — Z1211 Encounter for screening for malignant neoplasm of colon: Secondary | ICD-10-CM

## 2021-01-18 MED ORDER — ATORVASTATIN CALCIUM 10 MG PO TABS: 10.0000 mg | ORAL_TABLET | Freq: Every day | ORAL | 2 refills | Status: DC

## 2021-01-18 MED ORDER — SUPREP BOWEL PREP KIT 17.5-3.13-1.6 GM/177ML PO SOLN: 1.0000 | ORAL | 0 refills | Status: DC

## 2021-01-18 NOTE — Addendum Note (Signed)
Addended by: Jearld Fenton on: 01/18/2021 11:09 AM   Modules accepted: Orders

## 2021-01-19 ENCOUNTER — Encounter: Payer: Self-pay | Admitting: *Deleted

## 2021-02-05 DIAGNOSIS — Z419 Encounter for procedure for purposes other than remedying health state, unspecified: Secondary | ICD-10-CM | POA: Diagnosis not present

## 2021-02-10 ENCOUNTER — Encounter: Payer: Self-pay | Admitting: Gastroenterology

## 2021-02-10 ENCOUNTER — Other Ambulatory Visit: Payer: Self-pay

## 2021-02-12 DIAGNOSIS — H93A2 Pulsatile tinnitus, left ear: Secondary | ICD-10-CM | POA: Diagnosis not present

## 2021-02-12 DIAGNOSIS — H6983 Other specified disorders of Eustachian tube, bilateral: Secondary | ICD-10-CM | POA: Diagnosis not present

## 2021-02-12 DIAGNOSIS — H9313 Tinnitus, bilateral: Secondary | ICD-10-CM | POA: Diagnosis not present

## 2021-02-12 DIAGNOSIS — H9041 Sensorineural hearing loss, unilateral, right ear, with unrestricted hearing on the contralateral side: Secondary | ICD-10-CM | POA: Diagnosis not present

## 2021-02-12 DIAGNOSIS — J301 Allergic rhinitis due to pollen: Secondary | ICD-10-CM | POA: Diagnosis not present

## 2021-02-25 ENCOUNTER — Other Ambulatory Visit: Payer: Self-pay

## 2021-02-25 ENCOUNTER — Encounter
Admission: RE | Disposition: A | Discharge status: Home / Self Care | Payer: Self-pay | Source: Home / Self Care | Attending: Gastroenterology

## 2021-02-25 ENCOUNTER — Ambulatory Visit
Admission: RE | Admit: 2021-02-25 | Discharge: 2021-02-25 | Disposition: A | Discharge status: Home / Self Care | Payer: Medicaid Other | Attending: Gastroenterology | Admitting: Gastroenterology

## 2021-02-25 ENCOUNTER — Ambulatory Visit: Payer: Medicaid Other | Admitting: Anesthesiology

## 2021-02-25 ENCOUNTER — Encounter: Payer: Self-pay | Admitting: Gastroenterology

## 2021-02-25 DIAGNOSIS — Z7951 Long term (current) use of inhaled steroids: Secondary | ICD-10-CM | POA: Insufficient documentation

## 2021-02-25 DIAGNOSIS — F17210 Nicotine dependence, cigarettes, uncomplicated: Secondary | ICD-10-CM | POA: Insufficient documentation

## 2021-02-25 DIAGNOSIS — Z803 Family history of malignant neoplasm of breast: Secondary | ICD-10-CM | POA: Insufficient documentation

## 2021-02-25 DIAGNOSIS — Z808 Family history of malignant neoplasm of other organs or systems: Secondary | ICD-10-CM | POA: Diagnosis not present

## 2021-02-25 DIAGNOSIS — Z1211 Encounter for screening for malignant neoplasm of colon: Secondary | ICD-10-CM

## 2021-02-25 DIAGNOSIS — K5730 Diverticulosis of large intestine without perforation or abscess without bleeding: Secondary | ICD-10-CM | POA: Insufficient documentation

## 2021-02-25 DIAGNOSIS — D120 Benign neoplasm of cecum: Secondary | ICD-10-CM | POA: Diagnosis not present

## 2021-02-25 DIAGNOSIS — D125 Benign neoplasm of sigmoid colon: Secondary | ICD-10-CM | POA: Diagnosis not present

## 2021-02-25 DIAGNOSIS — K635 Polyp of colon: Secondary | ICD-10-CM | POA: Diagnosis not present

## 2021-02-25 DIAGNOSIS — Z79899 Other long term (current) drug therapy: Secondary | ICD-10-CM | POA: Diagnosis not present

## 2021-02-25 DIAGNOSIS — K5790 Diverticulosis of intestine, part unspecified, without perforation or abscess without bleeding: Secondary | ICD-10-CM | POA: Diagnosis not present

## 2021-02-25 HISTORY — PX: POLYPECTOMY: SHX5525

## 2021-02-25 HISTORY — PX: COLONOSCOPY WITH PROPOFOL: SHX5780

## 2021-02-25 LAB — POCT PREGNANCY, URINE: Preg Test, Ur: NEGATIVE

## 2021-02-25 SURGERY — COLONOSCOPY WITH PROPOFOL
Anesthesia: General | Site: Rectum

## 2021-02-25 MED ORDER — ACETAMINOPHEN 160 MG/5ML PO SOLN: 325.0000 mg | Freq: Once | ORAL | Status: DC

## 2021-02-25 MED ORDER — ACETAMINOPHEN 325 MG PO TABS: 325.0000 mg | ORAL_TABLET | Freq: Once | ORAL | Status: DC

## 2021-02-25 MED ORDER — PROPOFOL 10 MG/ML IV BOLUS
INTRAVENOUS | Status: DC | PRN
  Administered 2021-02-25: 20 mg via INTRAVENOUS
  Administered 2021-02-25: 80 mg via INTRAVENOUS
  Administered 2021-02-25 (×13): 20 mg via INTRAVENOUS

## 2021-02-25 MED ORDER — LACTATED RINGERS IV SOLN: INTRAVENOUS | Status: DC

## 2021-02-25 MED ORDER — STERILE WATER FOR IRRIGATION IR SOLN
Status: DC | PRN
  Administered 2021-02-25: .05 mL

## 2021-02-25 SURGICAL SUPPLY — 12 items
ELECT REM PT RETURN 9FT ADLT (ELECTROSURGICAL) ×3
ELECTRODE REM PT RTRN 9FT ADLT (ELECTROSURGICAL) ×2 IMPLANT
FORCEPS BIOP RAD 4 LRG CAP 4 (CUTTING FORCEPS) IMPLANT
GOWN CVR UNV OPN BCK APRN NK (MISCELLANEOUS) ×4 IMPLANT
GOWN ISOL THUMB LOOP REG UNIV (MISCELLANEOUS) ×6
KIT PRC NS LF DISP ENDO (KITS) ×2 IMPLANT
KIT PROCEDURE OLYMPUS (KITS) ×3
MANIFOLD NEPTUNE II (INSTRUMENTS) ×3 IMPLANT
SNARE COLD EXACTO (MISCELLANEOUS) ×3 IMPLANT
SNARE SHORT THROW 13M SML OVAL (MISCELLANEOUS) ×3 IMPLANT
TRAP ETRAP POLY (MISCELLANEOUS) ×3 IMPLANT
WATER STERILE IRR 250ML POUR (IV SOLUTION) ×3 IMPLANT

## 2021-02-25 NOTE — Anesthesia Postprocedure Evaluation (Signed)
Anesthesia Post Note  Patient: Tiffany Benton  Procedure(s) Performed: COLONOSCOPY WITH PROPOFOL (Rectum) POLYPECTOMY (Rectum)     Patient location during evaluation: PACU Anesthesia Type: General Level of consciousness: awake and alert and oriented Pain management: satisfactory to patient Vital Signs Assessment: post-procedure vital signs reviewed and stable Respiratory status: spontaneous breathing, nonlabored ventilation and respiratory function stable Cardiovascular status: blood pressure returned to baseline and stable Postop Assessment: Adequate PO intake and No signs of nausea or vomiting Anesthetic complications: no   No notable events documented.  Raliegh Ip

## 2021-02-25 NOTE — H&P (Signed)
Cephas Darby, MD [address]  [place]  [place], [place] [address]  Main: [phone]  Fax: [phone] Pager: [phone]  Primary Care Physician:  Jearld Fenton, NP Primary Gastroenterologist:  Dr. Cephas Darby  Pre-Procedure History & Physical: HPI:  Tiffany Benton is a 50 y.o. female is here for an colonoscopy.   Past Medical History:  Diagnosis Date   Allergy    Frequent headaches    Migraine     Past Surgical History:  Procedure Laterality Date   BREAST CYST ASPIRATION Left 2016   per pt   TUBAL LIGATION  06/29/07    Prior to Admission medications   Medication Sig Start Date End Date Taking? Authorizing Provider  acetaminophen (TYLENOL) 500 MG tablet Take 500 mg by mouth every 6 (six) hours as needed.   Yes [provider]  atorvastatin (LIPITOR) 10 MG tablet Take 1 tablet (10 mg total) by mouth daily. 01/18/21  Yes Jearld Fenton, NP  Black Cohosh 160 MG CAPS Take by mouth.   Yes [provider]  fluticasone (FLONASE) 50 MCG/ACT nasal spray Place 2 sprays into both nostrils daily. 12/13/19  Yes Malfi, Lupita Raider, FNP  loratadine (CLARITIN) 10 MG tablet Take 1 tablet (10 mg total) by mouth daily. 12/13/19  Yes Malfi, Lupita Raider, FNP  fluticasone furoate-vilanterol (BREO ELLIPTA) 100-25 MCG/INH AEPB Inhale 1 puff into the lungs daily. Patient not taking: Reported on 01/14/2021 12/23/19   Tyler Pita, MD  Na Sulfate-K Sulfate-Mg Sulf (SUPREP BOWEL PREP KIT) 17.5-3.13-1.6 GM/177ML SOLN Take 1 kit by mouth as directed. 01/18/21   Lin Landsman, MD    Allergies as of 01/18/2021   (No Known Allergies)    Family History  Problem Relation Age of Onset   Breast cancer Mother 22       metastatic   Bone cancer Maternal Grandfather     Social History   Socioeconomic History   Marital status: Divorced    Spouse name: Not on file   Number of children: Not on file   Years of education: Not on file   Highest education  level: Not on file  Occupational History   Not on file  Tobacco Use   Smoking status: Every Day    Packs/day: 0.50    Years: 25.00    Pack years: 12.50    Types: Cigarettes   Smokeless tobacco: Never  Vaping Use   Vaping Use: Never used  Substance and Sexual Activity   Alcohol use: Not Currently    Alcohol/week: 0.0 standard drinks   Drug use: No   Sexual activity: Not on file  Other Topics Concern   Not on file  Social History Narrative   Not on file   Social Determinants of Health   Financial Resource Strain: Not on file  Food Insecurity: Not on file  Transportation Needs: Not on file  Physical Activity: Not on file  Stress: Not on file  Social Connections: Not on file  Intimate Partner Violence: Not on file    Review of Systems: See HPI, otherwise negative ROS  Physical Exam: BP 131/79   Pulse (!) 112   Temp 98.1 F (36.7 C) (Temporal)   Ht 5' 6"  (1.676 m)   Wt 72.1 kg   SpO2 97%   BMI 25.66 kg/m  General:   Alert,  pleasant and cooperative in NAD Head:  Normocephalic and atraumatic. Neck:  Supple; no masses or thyromegaly. Lungs:  Clear throughout to  auscultation.    Heart:  Regular rate and rhythm. Abdomen:  Soft, nontender and nondistended. Normal bowel sounds, without guarding, and without rebound.   Neurologic:  Alert and  oriented x4;  grossly normal neurologically.  Impression/Plan: Alyson Ki is here for an colonoscopy to be performed for colon cancer screening  Risks, benefits, limitations, and alternatives regarding  colonoscopy have been reviewed with the patient.  Questions have been answered.  All parties agreeable.   Sherri Sear, MD  02/25/2021, 7:55 AM

## 2021-02-25 NOTE — Op Note (Signed)
[facility] Patient Name: Tiffany Benton Procedure Date: 02/25/2021 8:23 AM MRN: [id number] Account #: [id number] Date of Birth: 10-Sep-1970 Admit Type: Outpatient Age: 50 Room: [facility] OR ROOM 01 Gender: Female Note Status: Finalized Procedure:             Colonoscopy Indications:           Screening for colorectal malignant neoplasm, This is                         the patient's first colonoscopy Providers:             Lin Landsman MD, MD Referring MD:          Jearld Fenton (Referring MD) Medicines:             General Anesthesia Complications:         No immediate complications. Estimated blood loss: None. Procedure:             Pre-Anesthesia Assessment:                        - Prior to the procedure, a History and Physical was                         performed, and patient medications and allergies were                         reviewed. The patient is competent. The risks and                         benefits of the procedure and the sedation options and                         risks were discussed with the patient. All questions                         were answered and informed consent was obtained.                         Patient identification and proposed procedure were                         verified by the physician, the nurse, the                         anesthesiologist, the anesthetist and the technician                         in the pre-procedure area in the procedure room in the                         endoscopy suite. Mental Status Examination: alert and                         oriented. Airway Examination: normal oropharyngeal                         airway and neck mobility. Respiratory Examination:  clear to auscultation. CV Examination: normal.                         Prophylactic Antibiotics: The patient does not require                         prophylactic antibiotics. Prior Anticoagulants: The                          patient has taken no previous anticoagulant or                         antiplatelet agents. ASA Grade Assessment: II - A                         patient with mild systemic disease. After reviewing                         the risks and benefits, the patient was deemed in                         satisfactory condition to undergo the procedure. The                         anesthesia plan was to use general anesthesia.                         Immediately prior to administration of medications,                         the patient was re-assessed for adequacy to receive                         sedatives. The heart rate, respiratory rate, oxygen                         saturations, blood pressure, adequacy of pulmonary                         ventilation, and response to care were monitored                         throughout the procedure. The physical status of the                         patient was re-assessed after the procedure.                        After obtaining informed consent, the colonoscope was                         passed under direct vision. Throughout the procedure,                         the patient's blood pressure, pulse, and oxygen                         saturations were monitored continuously. The  Colonoscope was introduced through the anus and                         advanced to the the cecum, identified by appendiceal                         orifice and ileocecal valve. The colonoscopy was                         performed without difficulty. The patient tolerated                         the procedure well. The quality of the bowel                         preparation was evaluated using the BBPS [facility] Bowel                         Preparation Scale) with scores of: Right Colon = 3,                         Transverse Colon = 3 and Left Colon = 3 (entire mucosa                         seen well with no residual staining,  small fragments                         of stool or opaque liquid). The total BBPS score                         equals 9. Findings:      The perianal and digital rectal examinations were normal. Pertinent       negatives include normal sphincter tone and no palpable rectal lesions.      A 5 mm polyp was found in the cecum. The polyp was sessile. The polyp       was removed with a cold snare. Resection and retrieval were complete.      A 10 mm polyp was found in the sigmoid colon. The polyp was       pedunculated. The polyp was removed with a hot snare. Resection and       retrieval were complete.      A 3 mm polyp was found in the sigmoid colon. The polyp was sessile. The       polyp was removed with a cold snare. Resection and retrieval were       complete.      Multiple diverticula were found in the recto-sigmoid colon and sigmoid       colon.      The retroflexed view of the distal rectum and anal verge was normal and       showed no anal or rectal abnormalities. Impression:            - One 5 mm polyp in the cecum, removed with a cold                         snare. Resected and retrieved.                        -  One 10 mm polyp in the sigmoid colon, removed with a                         hot snare. Resected and retrieved.                        - One 3 mm polyp in the sigmoid colon, removed with a                         cold snare. Resected and retrieved.                        - Diverticulosis in the recto-sigmoid colon and in the                         sigmoid colon.                        - The distal rectum and anal verge are normal on                         retroflexion view. Recommendation:        - Discharge patient to home (with escort).                        - Resume previous diet today.                        - Continue present medications.                        - Await pathology results.                        - Repeat colonoscopy in 3 years for surveillance of                          multiple polyps. Procedure Code(s):     --- Professional ---                        [phone], Colonoscopy, flexible; with removal of                         tumor(s), polyp(s), or other lesion(s) by snare                         technique Diagnosis Code(s):     --- Professional ---                        Z12.11, Encounter for screening for malignant neoplasm                         of colon                        K63.5, Polyp of colon                        K57.30, Diverticulosis of large intestine without  perforation or abscess without bleeding CPT copyright 2019 American Medical Association. All rights reserved. The codes documented in this report are preliminary and upon coder review may  be revised to meet current compliance requirements. Dr. Ulyess Mort Lin Landsman MD, MD 02/25/2021 9:04:15 AM This report has been signed electronically. Number of Addenda: 0 Note Initiated On: 02/25/2021 8:23 AM Scope Withdrawal Time: 0 hours 18 minutes 58 seconds  Total Procedure Duration: 0 hours 26 minutes 26 seconds  Estimated Blood Loss:  Estimated blood loss: none.      [facility]

## 2021-02-25 NOTE — Anesthesia Procedure Notes (Signed)
Procedure Name: MAC Date/Time: 02/25/2021 8:30 AM Performed by: Vanetta Shawl, CRNA Pre-anesthesia Checklist: Patient identified, Emergency Drugs available, Suction available, Timeout performed and Patient being monitored Patient Re-evaluated:Patient Re-evaluated prior to induction Oxygen Delivery Method: Nasal cannula Placement Confirmation: positive ETCO2

## 2021-02-25 NOTE — Anesthesia Preprocedure Evaluation (Signed)
Anesthesia Evaluation  Patient identified by MRN, date of birth, ID band Patient awake    Reviewed: Allergy & Precautions, H&P , NPO status , Patient's Chart, lab work & pertinent test results  Airway Mallampati: II  TM Distance: >3 FB Neck ROM: full    Dental no notable dental hx.    Pulmonary Current Smoker and Patient abstained from smoking.,    Pulmonary exam normal breath sounds clear to auscultation       Cardiovascular Normal cardiovascular exam Rhythm:regular Rate:Normal     Neuro/Psych    GI/Hepatic   Endo/Other    Renal/GU      Musculoskeletal   Abdominal   Peds  Hematology   Anesthesia Other Findings   Reproductive/Obstetrics                             Anesthesia Physical Anesthesia Plan  ASA: 2  Anesthesia Plan: General   Post-op Pain Management:    Induction: Intravenous  PONV Risk Score and Plan: 3 and Treatment may vary due to age or medical condition, Propofol infusion and TIVA  Airway Management Planned: Natural Airway  Additional Equipment:   Intra-op Plan:   Post-operative Plan:   Informed Consent: I have reviewed the patients History and Physical, chart, labs and discussed the procedure including the risks, benefits and alternatives for the proposed anesthesia with the patient or authorized representative who has indicated his/her understanding and acceptance.     Dental Advisory Given  Plan Discussed with: CRNA  Anesthesia Plan Comments:         Anesthesia Quick Evaluation

## 2021-02-25 NOTE — Transfer of Care (Signed)
Immediate Anesthesia Transfer of Care Note  Patient: Tiffany Benton  Procedure(s) Performed: COLONOSCOPY WITH PROPOFOL (Rectum) POLYPECTOMY (Rectum)  Patient Location: PACU  Anesthesia Type: General  Level of Consciousness: awake, alert  and patient cooperative  Airway and Oxygen Therapy: Patient Spontanous Breathing   Post-op Assessment: Post-op Vital signs reviewed, Patient's Cardiovascular Status Stable, Respiratory Function Stable, Patent Airway and No signs of Nausea or vomiting  Post-op Vital Signs: Reviewed and stable  Complications: No notable events documented.

## 2021-02-26 ENCOUNTER — Encounter: Payer: Self-pay | Admitting: Gastroenterology

## 2021-02-26 LAB — SURGICAL PATHOLOGY

## 2021-03-08 DIAGNOSIS — Z419 Encounter for procedure for purposes other than remedying health state, unspecified: Secondary | ICD-10-CM | POA: Diagnosis not present

## 2021-03-19 ENCOUNTER — Other Ambulatory Visit: Payer: Self-pay

## 2021-03-19 DIAGNOSIS — E782 Mixed hyperlipidemia: Secondary | ICD-10-CM

## 2021-03-22 ENCOUNTER — Other Ambulatory Visit: Payer: Medicaid Other

## 2021-03-22 DIAGNOSIS — E782 Mixed hyperlipidemia: Secondary | ICD-10-CM | POA: Diagnosis not present

## 2021-03-23 ENCOUNTER — Encounter: Payer: Self-pay | Admitting: Internal Medicine

## 2021-03-23 DIAGNOSIS — E782 Mixed hyperlipidemia: Secondary | ICD-10-CM

## 2021-03-23 LAB — COMPLETE METABOLIC PANEL WITH GFR
AG Ratio: 2.1 (calc) (ref 1.0–2.5)
ALT: 11 U/L (ref 6–29)
AST: 13 U/L (ref 10–35)
Albumin: 4.4 g/dL (ref 3.6–5.1)
Alkaline phosphatase (APISO): 83 U/L (ref 37–153)
BUN: 20 mg/dL (ref 7–25)
CO2: 25 mmol/L (ref 20–32)
Calcium: 9.4 mg/dL (ref 8.6–10.4)
Chloride: 108 mmol/L (ref 98–110)
Creat: 0.68 mg/dL (ref 0.50–1.03)
Globulin: 2.1 g/dL (calc) (ref 1.9–3.7)
Glucose, Bld: 95 mg/dL (ref 65–139)
Potassium: 4 mmol/L (ref 3.5–5.3)
Sodium: 141 mmol/L (ref 135–146)
Total Bilirubin: 0.4 mg/dL (ref 0.2–1.2)
Total Protein: 6.5 g/dL (ref 6.1–8.1)
eGFR: 106 mL/min/{1.73_m2} (ref >=60)

## 2021-03-23 LAB — LIPID PANEL
Cholesterol: 186 mg/dL (ref <200)
HDL: 45 mg/dL — ABNORMAL LOW (ref >=50)
LDL Cholesterol (Calc): 109 mg/dL (calc) — ABNORMAL HIGH
Non-HDL Cholesterol (Calc): 141 mg/dL (calc) — ABNORMAL HIGH (ref <130)
Total CHOL/HDL Ratio: 4.1 (calc) (ref <5.0)
Triglycerides: 197 mg/dL — ABNORMAL HIGH (ref <150)

## 2021-03-23 MED ORDER — ATORVASTATIN CALCIUM 20 MG PO TABS: 20.0000 mg | ORAL_TABLET | Freq: Every day | ORAL | 0 refills | Status: DC

## 2021-03-26 ENCOUNTER — Other Ambulatory Visit: Payer: Self-pay

## 2021-04-08 DIAGNOSIS — Z419 Encounter for procedure for purposes other than remedying health state, unspecified: Secondary | ICD-10-CM | POA: Diagnosis not present

## 2021-04-12 ENCOUNTER — Other Ambulatory Visit: Payer: Self-pay | Admitting: Internal Medicine

## 2021-05-08 DIAGNOSIS — Z419 Encounter for procedure for purposes other than remedying health state, unspecified: Secondary | ICD-10-CM | POA: Diagnosis not present

## 2021-06-08 DIAGNOSIS — Z419 Encounter for procedure for purposes other than remedying health state, unspecified: Secondary | ICD-10-CM | POA: Diagnosis not present

## 2021-06-22 ENCOUNTER — Other Ambulatory Visit: Payer: Self-pay

## 2021-06-22 DIAGNOSIS — E782 Mixed hyperlipidemia: Secondary | ICD-10-CM

## 2021-06-23 ENCOUNTER — Other Ambulatory Visit: Payer: Medicaid Other

## 2021-06-23 ENCOUNTER — Other Ambulatory Visit: Payer: Self-pay

## 2021-06-23 DIAGNOSIS — E782 Mixed hyperlipidemia: Secondary | ICD-10-CM | POA: Diagnosis not present

## 2021-06-24 ENCOUNTER — Encounter: Payer: Self-pay | Admitting: Internal Medicine

## 2021-06-24 ENCOUNTER — Other Ambulatory Visit: Payer: Self-pay | Admitting: Internal Medicine

## 2021-06-24 LAB — LIPID PANEL
Cholesterol: 215 mg/dL — ABNORMAL HIGH (ref <200)
HDL: 47 mg/dL — ABNORMAL LOW (ref >=50)
LDL Cholesterol (Calc): 135 mg/dL (calc) — ABNORMAL HIGH
Non-HDL Cholesterol (Calc): 168 mg/dL (calc) — ABNORMAL HIGH (ref <130)
Total CHOL/HDL Ratio: 4.6 (calc) (ref <5.0)
Triglycerides: 193 mg/dL — ABNORMAL HIGH (ref <150)

## 2021-06-24 MED ORDER — ROSUVASTATIN CALCIUM 10 MG PO TABS: 10.0000 mg | ORAL_TABLET | Freq: Every day | ORAL | 0 refills | Status: DC

## 2021-06-24 NOTE — Telephone Encounter (Signed)
Requested Prescriptions  Pending Prescriptions Disp Refills  . atorvastatin (LIPITOR) 20 MG tablet [Pharmacy Med Name: ATORVASTATIN 20MG  TABLETS] 90 tablet 2    Sig: TAKE 1 TABLET(20 MG) BY MOUTH DAILY     Cardiovascular:  Antilipid - Statins Failed - 06/24/2021  3:19 AM      Failed - Total Cholesterol in normal range and within 360 days    Cholesterol  Date Value Ref Range Status  06/23/2021 215 (H) <200 mg/dL Final         Failed - LDL in normal range and within 360 days    LDL Cholesterol (Calc)  Date Value Ref Range Status  06/23/2021 135 (H) mg/dL (calc) Final    Comment:    Reference range: <100 . Desirable range <100 mg/dL for primary prevention;   <70 mg/dL for patients with CHD or diabetic patients  with > or = 2 CHD risk factors. Marland Kitchen LDL-C is now calculated using the Martin-Hopkins  calculation, which is a validated novel method providing  better accuracy than the Friedewald equation in the  estimation of LDL-C.  Cresenciano Genre et al. Annamaria Helling. 9449;675(91): 2061-2068  (http://education.[employer].com/faq/FAQ164)          Failed - HDL in normal range and within 360 days    HDL  Date Value Ref Range Status  06/23/2021 47 (L) > OR = 50 mg/dL Final         Failed - Triglycerides in normal range and within 360 days    Triglycerides  Date Value Ref Range Status  06/23/2021 193 (H) <150 mg/dL Final         Passed - Patient is not pregnant      Passed - Valid encounter within last 12 months    Recent Outpatient Visits          5 months ago Encounter for general adult medical examination with abnormal findings   [facility] [place], Coralie Keens, NP   1 year ago Annual physical exam   [facility], Lupita Raider, FNP   1 year ago Tinnitus of both [place], FNP   1 year ago Routine medical exam   [facility], Lupita Raider, FNP      Future Appointments            In 6 months  Baity, Coralie Keens, NP [facility], [facility]

## 2021-07-08 ENCOUNTER — Ambulatory Visit: Payer: Self-pay | Admitting: *Deleted

## 2021-07-08 DIAGNOSIS — Z419 Encounter for procedure for purposes other than remedying health state, unspecified: Secondary | ICD-10-CM | POA: Diagnosis not present

## 2021-07-08 NOTE — Telephone Encounter (Signed)
Pt. Reports she started taking Crestor Monday and that day started having diarrhea and stomach cramps. "I don't think I can that." Please advise pt.    Answer Assessment - Initial Assessment Questions 1. NAME of MEDICATION: "What medicine are you calling about?"     Crestor 2. QUESTION: "What is your question?" (e.g., double dose of medicine, side effect)     Side effects 3. PRESCRIBING HCP: "Who prescribed it?" Reason: if prescribed by specialist, call should be referred to that group.     Baity 4. SYMPTOMS: "Do you have any symptoms?"     Started Crestor Monday - diarrhea and stomach cramping 5. SEVERITY: If symptoms are present, ask "Are they mild, moderate or severe?"     Moderate 6. PREGNANCY:  "Is there any chance that you are pregnant?" "When was your last menstrual period?"     No  Protocols used: Medication Question Call-A-AH

## 2021-07-08 NOTE — Telephone Encounter (Signed)
Would she be willing to try cutting it in half and take 5 mg every other day and see if symptoms continue?

## 2021-07-08 NOTE — Telephone Encounter (Signed)
Per agent: "Pt called saying the cholesterol medication that she was put on is causing stomach ache and diarrhea .  She wants to know if there is something else she can take. "  CB# [phone]   Attempted to reach pt, left VM to call back to discuss symptoms.

## 2021-07-09 NOTE — Telephone Encounter (Signed)
I spoke with the patient on the phone and she said she will try cutting them in half. She was concern that she would not be able to half the tablets because they are so small.

## 2021-08-08 DIAGNOSIS — Z419 Encounter for procedure for purposes other than remedying health state, unspecified: Secondary | ICD-10-CM | POA: Diagnosis not present

## 2021-09-08 DIAGNOSIS — Z419 Encounter for procedure for purposes other than remedying health state, unspecified: Secondary | ICD-10-CM | POA: Diagnosis not present

## 2021-09-25 ENCOUNTER — Other Ambulatory Visit: Payer: Self-pay | Admitting: Internal Medicine

## 2021-09-27 ENCOUNTER — Encounter: Payer: Self-pay | Admitting: Internal Medicine

## 2021-09-27 ENCOUNTER — Ambulatory Visit: Payer: Medicaid Other | Admitting: Internal Medicine

## 2021-09-27 ENCOUNTER — Other Ambulatory Visit: Payer: Self-pay

## 2021-09-27 VITALS — BP 128/69 | HR 96 | Temp 97.1°F | Ht 66.0 in | Wt 169.0 lb

## 2021-09-27 DIAGNOSIS — Z0001 Encounter for general adult medical examination with abnormal findings: Secondary | ICD-10-CM

## 2021-09-27 DIAGNOSIS — E663 Overweight: Secondary | ICD-10-CM | POA: Diagnosis not present

## 2021-09-27 DIAGNOSIS — Z1231 Encounter for screening mammogram for malignant neoplasm of breast: Secondary | ICD-10-CM | POA: Diagnosis not present

## 2021-09-27 DIAGNOSIS — Z6827 Body mass index (BMI) 27.0-27.9, adult: Secondary | ICD-10-CM

## 2021-09-27 DIAGNOSIS — F17200 Nicotine dependence, unspecified, uncomplicated: Secondary | ICD-10-CM | POA: Insufficient documentation

## 2021-09-27 DIAGNOSIS — I700 Atherosclerosis of aorta: Secondary | ICD-10-CM | POA: Insufficient documentation

## 2021-09-27 LAB — COMPLETE METABOLIC PANEL WITH GFR
AG Ratio: 1.8 (calc) (ref 1.0–2.5)
ALT: 30 U/L — ABNORMAL HIGH (ref 6–29)
AST: 22 U/L (ref 10–35)
Albumin: 4.4 g/dL (ref 3.6–5.1)
Alkaline phosphatase (APISO): 87 U/L (ref 37–153)
BUN: 19 mg/dL (ref 7–25)
CO2: 24 mmol/L (ref 20–32)
Calcium: 9.4 mg/dL (ref 8.6–10.4)
Chloride: 107 mmol/L (ref 98–110)
Creat: 0.66 mg/dL (ref 0.50–1.03)
Globulin: 2.4 g/dL (calc) (ref 1.9–3.7)
Glucose, Bld: 91 mg/dL (ref 65–99)
Potassium: 4.2 mmol/L (ref 3.5–5.3)
Sodium: 139 mmol/L (ref 135–146)
Total Bilirubin: 0.4 mg/dL (ref 0.2–1.2)
Total Protein: 6.8 g/dL (ref 6.1–8.1)
eGFR: 107 mL/min/{1.73_m2} (ref >=60)

## 2021-09-27 LAB — LIPID PANEL
Cholesterol: 190 mg/dL (ref <200)
HDL: 47 mg/dL — ABNORMAL LOW (ref >=50)
LDL Cholesterol (Calc): 112 mg/dL (calc) — ABNORMAL HIGH
Non-HDL Cholesterol (Calc): 143 mg/dL (calc) — ABNORMAL HIGH (ref <130)
Total CHOL/HDL Ratio: 4 (calc) (ref <5.0)
Triglycerides: 190 mg/dL — ABNORMAL HIGH (ref <150)

## 2021-09-27 LAB — CBC
HCT: 41.1 % (ref 35.0–45.0)
Hemoglobin: 13.7 g/dL (ref 11.7–15.5)
MCH: 32.4 pg (ref 27.0–33.0)
MCHC: 33.3 g/dL (ref 32.0–36.0)
MCV: 97.2 fL (ref 80.0–100.0)
MPV: 11.1 fL (ref 7.5–12.5)
Platelets: 230 10*3/uL (ref 140–400)
RBC: 4.23 10*6/uL (ref 3.80–5.10)
RDW: 11.8 % (ref 11.0–15.0)
WBC: 13.2 10*3/uL — ABNORMAL HIGH (ref 3.8–10.8)

## 2021-09-27 NOTE — Assessment & Plan Note (Signed)
Encourage diet and exercise for weight loss 

## 2021-09-27 NOTE — Assessment & Plan Note (Signed)
We will order CT low-dose lung cancer screening

## 2021-09-27 NOTE — Progress Notes (Signed)
Subjective:    Patient ID: Tiffany Benton, female    DOB: 15-Dec-1970, 51 y.o.   MRN: [id number]  HPI  Patient presents to clinic today for her annual exam.  Flu: never Tetanus: 12/2019 COVID: never Shingrix: never Pap smear: 01/2020 Mammogram: 10/2018 Colon screening: 02/2021, 3 years Vision screening: as needed Dentist: biannually  Diet: She does eat mostly lean meat. She consumes fruits and veggies. She tries to avoid fried foods. She drinks mostly water or sweet tea. Exercise: Walking   Review of Systems     Past Medical History:  Diagnosis Date   Allergy    Frequent headaches    Migraine     Current Outpatient Medications  Medication Sig Dispense Refill   acetaminophen (TYLENOL) 500 MG tablet Take 500 mg by mouth every 6 (six) hours as needed.     Black Cohosh 160 MG CAPS Take by mouth.     fluticasone (FLONASE) 50 MCG/ACT nasal spray Place 2 sprays into both nostrils daily. 16 g 3   loratadine (CLARITIN) 10 MG tablet Take 1 tablet (10 mg total) by mouth daily. 90 tablet 1   rosuvastatin (CRESTOR) 10 MG tablet Take 1 tablet (10 mg total) by mouth daily. 90 tablet 0   No current facility-administered medications for this visit.    No Known Allergies  Family History  Problem Relation Age of Onset   Breast cancer Mother 76       metastatic   Bone cancer Maternal Grandfather     Social History   Socioeconomic History   Marital status: Divorced    Spouse name: Not on file   Number of children: Not on file   Years of education: Not on file   Highest education level: Not on file  Occupational History   Not on file  Tobacco Use   Smoking status: Every Day    Packs/day: 0.50    Years: 25.00    Pack years: 12.50    Types: Cigarettes   Smokeless tobacco: Never  Vaping Use   Vaping Use: Never used  Substance and Sexual Activity   Alcohol use: Not Currently    Alcohol/week: 0.0 standard drinks   Drug use: No   Sexual activity: Not on file   Other Topics Concern   Not on file  Social History Narrative   Not on file   Social Determinants of Health   Financial Resource Strain: Not on file  Food Insecurity: Not on file  Transportation Needs: Not on file  Physical Activity: Not on file  Stress: Not on file  Social Connections: Not on file  Intimate Partner Violence: Not on file     Constitutional: Denies fever, malaise, fatigue, headache or abrupt weight changes.  HEENT: Denies eye pain, eye redness, ear pain, ringing in the ears, wax buildup, runny nose, nasal congestion, bloody nose, or sore throat. Respiratory: Denies difficulty breathing, shortness of breath, cough or sputum production.   Cardiovascular: Denies chest pain, chest tightness, palpitations or swelling in the hands or feet.  Gastrointestinal: Patient reports intermittent constipation.  Denies abdominal pain, bloating, diarrhea or blood in the stool.  GU: Denies urgency, frequency, pain with urination, burning sensation, blood in urine, odor or discharge. Musculoskeletal: Denies decrease in range of motion, difficulty with gait, muscle pain or joint pain and swelling.  Skin: Denies redness, rashes, lesions or ulcercations.  Neurological: Denies dizziness, difficulty with memory, difficulty with speech or problems with balance and coordination.  Psych: Denies anxiety, depression, SI/HI.  No other specific complaints in a complete review of systems (except as listed in HPI above).  Objective:   Physical Exam  BP 128/69 (BP Location: Right Arm, Patient Position: Sitting, Cuff Size: Normal)    Pulse 96    Temp (!) 97.1 F (36.2 C) (Temporal)    Ht _0  (1.676 m)    Wt 169 lb (76.7 kg)    SpO2 98%    BMI 27.28 kg/m   Wt Readings from Last 3 Encounters:  02/25/21 159 lb (72.1 kg)  01/14/21 157 lb (71.2 kg)  01/10/20 163 lb (73.9 kg)    General: Appears her stated age, overweight, in NAD. Skin: Warm, dry and intact.  Skin tags noted of neck. HEENT:  Head: normal shape and size; Eyes: sclera white and EOMs intact;  Neck:  Neck supple, trachea midline. No masses, lumps or thyromegaly present.  Cardiovascular: Normal rate and rhythm. S1,S2 noted.  No murmur, rubs or gallops noted. No JVD or BLE edema. No carotid bruits noted. Pulmonary/Chest: Normal effort and positive vesicular breath sounds with intermittent expiratory wheezing noted, R >L. No respiratory distress. No rales or ronchi noted.  Abdomen: Soft and nontender. Normal bowel sounds.  Musculoskeletal: Strength 5/5 BUE/BLE.  No difficulty with gait.  Neurological: Alert and oriented. Cranial nerves II-XII grossly intact. Coordination normal.  Psychiatric: Mood and affect normal. Behavior is normal. Judgment and thought content normal.   BMET    Component Value Date/Time   NA 141 03/22/2021 0823   K 4.0 03/22/2021 0823   CL 108 03/22/2021 0823   CO2 25 03/22/2021 0823   GLUCOSE 95 03/22/2021 0823   BUN 20 03/22/2021 0823   CREATININE 0.68 03/22/2021 0823   CALCIUM 9.4 03/22/2021 0823   GFRNONAA 105 01/14/2021 1026   GFRAA 121 01/14/2021 1026    Lipid Panel     Component Value Date/Time   CHOL 215 (H) 06/23/2021 0805   TRIG 193 (H) 06/23/2021 0805   HDL 47 (L) 06/23/2021 0805   CHOLHDL 4.6 06/23/2021 0805   LDLCALC 135 (H) 06/23/2021 0805    CBC    Component Value Date/Time   WBC 12.5 (H) 01/14/2021 1026   RBC 4.37 01/14/2021 1026   HGB 14.0 01/14/2021 1026   HCT 42.0 01/14/2021 1026   PLT 264 01/14/2021 1026   MCV 96.1 01/14/2021 1026   MCH 32.0 01/14/2021 1026   MCHC 33.3 01/14/2021 1026   RDW 11.8 01/14/2021 1026   LYMPHSABS 2,407 01/10/2020 1045   EOSABS 143 01/10/2020 1045   BASOSABS 71 01/10/2020 1045    Hgb A1C Lab Results  Component Value Date   HGBA1C 5.4 01/14/2021           Assessment & Plan:   Preventative Health Maintenance:  She declines flu shots Tetanus UTD Encouraged her to get her COVID booster Discussed Shingrix vaccine,  she will check coverage with her insurance company Pap smear UTD Mammogram ordered-she will call to schedule Colon screening UTD  Encouraged her to consume a balanced diet and exercise regimen Advised her to see an eye doctor and dentist annually We will check CBC, c-Met and lipid profile today  RTC in 1 year for your annual exam Webb Silversmith, NP This visit occurred during the SARS-CoV-2 public health emergency.  Safety protocols were in place, including screening questions prior to the visit, additional usage of staff PPE, and extensive cleaning of exam room while observing appropriate contact time as indicated for disinfecting solutions.

## 2021-09-27 NOTE — Patient Instructions (Signed)

## 2021-09-27 NOTE — Telephone Encounter (Signed)
Requested Prescriptions  Pending Prescriptions Disp Refills   rosuvastatin (CRESTOR) 10 MG tablet [Pharmacy Med Name: ROSUVASTATIN 10MG  TABLETS] 90 tablet 1    Sig: TAKE 1 TABLET(10 MG) BY MOUTH DAILY     Cardiovascular:  Antilipid - Statins 2 Failed - 09/25/2021 10:36 AM      Failed - Lipid Panel in normal range within the last 12 months    Cholesterol  Date Value Ref Range Status  06/23/2021 215 (H) <200 mg/dL Final   LDL Cholesterol (Calc)  Date Value Ref Range Status  06/23/2021 135 (H) mg/dL (calc) Final    Comment:    Reference range: <100 . Desirable range <100 mg/dL for primary prevention;   <70 mg/dL for patients with CHD or diabetic patients  with > or = 2 CHD risk factors. Marland Kitchen LDL-C is now calculated using the Martin-Hopkins  calculation, which is a validated novel method providing  better accuracy than the Friedewald equation in the  estimation of LDL-C.  Cresenciano Genre et al. Annamaria Helling. 1962;229(79): 2061-2068  (http://education.[facility].com/faq/FAQ164)    HDL  Date Value Ref Range Status  06/23/2021 47 (L) > OR = 50 mg/dL Final   Triglycerides  Date Value Ref Range Status  06/23/2021 193 (H) <150 mg/dL Final         Passed - Cr in normal range and within 360 days    Creat  Date Value Ref Range Status  03/22/2021 0.68 0.50 - 1.03 mg/dL Final         Passed - Patient is not pregnant      Passed - Valid encounter within last 12 months    Recent Outpatient Visits          Today Encounter for general adult medical examination with abnormal findings   [place], NP   8 months ago Encounter for general adult medical examination with abnormal findings   [facility], Coralie Keens, NP   1 year ago Annual physical exam   [facility], Lupita Raider, FNP   1 year ago [place]innitus of both [place], FNP   1 year ago Routine medical exam   [facility], Lupita Raider, FNP      Future Appointments            In 3 months Baity, Coralie Keens, NP [facility], [facility]

## 2021-09-28 ENCOUNTER — Encounter: Payer: Self-pay | Admitting: Internal Medicine

## 2021-09-28 MED ORDER — FENOFIBRATE MICRONIZED 67 MG PO CAPS: 67.0000 mg | ORAL_CAPSULE | Freq: Every day | ORAL | 2 refills | Status: DC

## 2021-09-30 ENCOUNTER — Telehealth: Payer: Self-pay

## 2021-09-30 MED ORDER — FENOFIBRATE 145 MG PO TABS: 145.0000 mg | ORAL_TABLET | Freq: Every day | ORAL | 0 refills | Status: DC

## 2021-09-30 NOTE — Telephone Encounter (Signed)
Can you change to fenofibrate 154 mg

## 2021-09-30 NOTE — Telephone Encounter (Signed)
Correction ** RX for fenofibrate 145mg  was sent to [facility]**  [facility] 09/30/2021.  PEC please advise pt we had to change her fenofibrate from 67 to 145mg  per insurance.   Thanks,   -Mickel Baas

## 2021-09-30 NOTE — Addendum Note (Signed)
Addended by: Ashley Royalty E on: 09/30/2021 03:52 PM   Modules accepted: Orders

## 2021-09-30 NOTE — Telephone Encounter (Signed)
Pt's insurance denied fenofibrate 67mg .  They will approve the following:   Outcome Deniedon February 22 Denied. The drug that you asked for is not covered. We are denying your request because we do not show that you have tried at least 2 preferred drugs. Other covered drug(s) is/are: Fenofibrate Oral Tablet (48mg , 145mg ), Gemfibrozil Oral Tablet 600mg . Note: Some covered drug(s) may have limits on the quantity you can get. You can get this information on the list of covered drugs (Preferred Drug List) for details.

## 2021-10-06 DIAGNOSIS — Z419 Encounter for procedure for purposes other than remedying health state, unspecified: Secondary | ICD-10-CM | POA: Diagnosis not present

## 2021-11-06 DIAGNOSIS — Z419 Encounter for procedure for purposes other than remedying health state, unspecified: Secondary | ICD-10-CM | POA: Diagnosis not present

## 2021-12-06 DIAGNOSIS — Z419 Encounter for procedure for purposes other than remedying health state, unspecified: Secondary | ICD-10-CM | POA: Diagnosis not present

## 2022-01-06 DIAGNOSIS — Z419 Encounter for procedure for purposes other than remedying health state, unspecified: Secondary | ICD-10-CM | POA: Diagnosis not present

## 2022-01-17 ENCOUNTER — Encounter: Payer: Medicaid Other | Admitting: Internal Medicine

## 2022-02-05 DIAGNOSIS — Z419 Encounter for procedure for purposes other than remedying health state, unspecified: Secondary | ICD-10-CM | POA: Diagnosis not present

## 2022-03-08 DIAGNOSIS — Z419 Encounter for procedure for purposes other than remedying health state, unspecified: Secondary | ICD-10-CM | POA: Diagnosis not present

## 2022-04-08 DIAGNOSIS — Z419 Encounter for procedure for purposes other than remedying health state, unspecified: Secondary | ICD-10-CM | POA: Diagnosis not present

## 2022-05-08 DIAGNOSIS — Z419 Encounter for procedure for purposes other than remedying health state, unspecified: Secondary | ICD-10-CM | POA: Diagnosis not present

## 2022-06-08 DIAGNOSIS — Z419 Encounter for procedure for purposes other than remedying health state, unspecified: Secondary | ICD-10-CM | POA: Diagnosis not present

## 2022-06-18 ENCOUNTER — Other Ambulatory Visit: Payer: Self-pay | Admitting: Internal Medicine

## 2022-06-20 NOTE — Telephone Encounter (Signed)
Requested Prescriptions  Pending Prescriptions Disp Refills   fenofibrate (TRICOR) 145 MG tablet [Pharmacy Med Name: FENOFIBRATE 145MG TABLETS] 90 tablet 0    Sig: TAKE 1 TABLET(145 MG) BY MOUTH DAILY     Cardiovascular:  Antilipid - Fibric Acid Derivatives Failed - 06/18/2022  8:54 AM      Failed - ALT in normal range and within 360 days    ALT  Date Value Ref Range Status  09/27/2021 30 (H) 6 - 29 U/L Final         Failed - WBC in normal range and within 360 days    WBC  Date Value Ref Range Status  09/27/2021 13.2 (H) 3.8 - 10.8 Thousand/uL Final         Failed - Lipid Panel in normal range within the last 12 months    Cholesterol  Date Value Ref Range Status  09/27/2021 190 <200 mg/dL Final   LDL Cholesterol (Calc)  Date Value Ref Range Status  09/27/2021 112 (H) mg/dL (calc) Final    Comment:    Reference range: <100 . Desirable range <100 mg/dL for primary prevention;   <70 mg/dL for patients with CHD or diabetic patients  with > or = 2 CHD risk factors. Marland Kitchen LDL-C is now calculated using the Martin-Hopkins  calculation, which is a validated novel method providing  better accuracy than the Friedewald equation in the  estimation of LDL-C.  Cresenciano Genre et al. Annamaria Helling. 9179;150(56): 2061-2068  (http://education.[facility].com/faq/FAQ164)    HDL  Date Value Ref Range Status  09/27/2021 47 (L) > OR = 50 mg/dL Final   Triglycerides  Date Value Ref Range Status  09/27/2021 190 (H) <150 mg/dL Final         Passed - AST in normal range and within 360 days    AST  Date Value Ref Range Status  09/27/2021 22 10 - 35 U/L Final         Passed - Cr in normal range and within 360 days    Creat  Date Value Ref Range Status  09/27/2021 0.66 0.50 - 1.03 mg/dL Final         Passed - HGB in normal range and within 360 days    Hemoglobin  Date Value Ref Range Status  09/27/2021 13.7 11.7 - 15.5 g/dL Final         Passed - HCT in normal range and within 360 days     HCT  Date Value Ref Range Status  09/27/2021 41.1 35.0 - 45.0 % Final         Passed - PLT in normal range and within 360 days    Platelets  Date Value Ref Range Status  09/27/2021 230 140 - 400 Thousand/uL Final         Passed - eGFR is 30 or above and within 360 days    GFR, Est African American  Date Value Ref Range Status  01/14/2021 121 > OR = 60 mL/min/1.52m Final   GFR, Est Non African American  Date Value Ref Range Status  01/14/2021 105 > OR = 60 mL/min/1.760mFinal   eGFR  Date Value Ref Range Status  09/27/2021 107 > OR = 60 mL/min/1.7334minal    Comment:    The eGFR is based on the CKD-EPI 2021 equation. To calculate  the new eGFR from a previous Creatinine or Cystatin C result, go to https://www.kidney.org/professionals/ kdoqi/gfr%5Fcalculator          Passed - Valid encounter within  last 12 months    Recent Outpatient Visits           8 months ago Encounter for general adult medical examination with abnormal findings   [facility] [place], Coralie Keens, NP   1 year ago Encounter for general adult medical examination with abnormal findings   [facility] [place], Coralie Keens, NP   2 years ago Annual physical exam   [facility], Lupita Raider, FNP   2 years ago Tinnitus of both [place], FNP   2 years ago Routine medical exam   [facility], Lupita Raider, FNP       Future Appointments             In 3 months Baity, Coralie Keens, NP [facility], [facility]

## 2022-07-08 DIAGNOSIS — Z419 Encounter for procedure for purposes other than remedying health state, unspecified: Secondary | ICD-10-CM | POA: Diagnosis not present

## 2022-08-06 ENCOUNTER — Other Ambulatory Visit: Payer: Self-pay | Admitting: Internal Medicine

## 2022-08-07 NOTE — Telephone Encounter (Signed)
Requested Prescriptions  Pending Prescriptions Disp Refills   rosuvastatin (CRESTOR) 10 MG tablet [Pharmacy Med Name: ROSUVASTATIN '10MG'$  TABLETS] 90 tablet 0    Sig: TAKE 1 TABLET(10 MG) BY MOUTH DAILY     Cardiovascular:  Antilipid - Statins 2 Failed - 08/06/2022  8:39 AM      Failed - Lipid Panel in normal range within the last 12 months    Cholesterol  Date Value Ref Range Status  09/27/2021 190 <200 mg/dL Final   LDL Cholesterol (Calc)  Date Value Ref Range Status  09/27/2021 112 (H) mg/dL (calc) Final    Comment:    Reference range: <100 . Desirable range <100 mg/dL for primary prevention;   <70 mg/dL for patients with CHD or diabetic patients  with > or = 2 CHD risk factors. Marland Kitchen LDL-C is now calculated using the Martin-Hopkins  calculation, which is a validated novel method providing  better accuracy than the Friedewald equation in the  estimation of LDL-C.  Cresenciano Genre et al. Annamaria Helling. 6283;151(76): 2061-2068  (http://education.[facility].com/faq/FAQ164)    HDL  Date Value Ref Range Status  09/27/2021 47 (L) > OR = 50 mg/dL Final   Triglycerides  Date Value Ref Range Status  09/27/2021 190 (H) <150 mg/dL Final         Passed - Cr in normal range and within 360 days    Creat  Date Value Ref Range Status  09/27/2021 0.66 0.50 - 1.03 mg/dL Final         Passed - Patient is not pregnant      Passed - Valid encounter within last 12 months    Recent Outpatient Visits           10 months ago Encounter for general adult medical examination with abnormal findings   [facility] [place], Coralie Keens, NP   1 year ago Encounter for general adult medical examination with abnormal findings   [facility] [place], Coralie Keens, NP   2 years ago Annual physical exam   [facility] [place], Lupita Raider, FNP   2 years ago Tinnitus of both [place], Lupita Raider, FNP   2 years ago Routine medical exam   [facility], Lupita Raider, FNP       Future Appointments             In 1 month Baity, Coralie Keens, NP [facility], [facility]

## 2022-08-08 DIAGNOSIS — Z419 Encounter for procedure for purposes other than remedying health state, unspecified: Secondary | ICD-10-CM | POA: Diagnosis not present

## 2022-09-08 DIAGNOSIS — Z419 Encounter for procedure for purposes other than remedying health state, unspecified: Secondary | ICD-10-CM | POA: Diagnosis not present

## 2022-09-09 ENCOUNTER — Encounter: Payer: Self-pay | Admitting: *Deleted

## 2022-09-27 ENCOUNTER — Encounter: Payer: Self-pay | Admitting: Internal Medicine

## 2022-09-27 ENCOUNTER — Ambulatory Visit (INDEPENDENT_AMBULATORY_CARE_PROVIDER_SITE_OTHER): Payer: Medicaid Other | Admitting: Internal Medicine

## 2022-09-27 VITALS — BP 128/84 | HR 108 | Temp 96.6°F | Ht 66.0 in | Wt 186.0 lb

## 2022-09-27 DIAGNOSIS — R7309 Other abnormal glucose: Secondary | ICD-10-CM | POA: Diagnosis not present

## 2022-09-27 DIAGNOSIS — Z6830 Body mass index (BMI) 30.0-30.9, adult: Secondary | ICD-10-CM | POA: Diagnosis not present

## 2022-09-27 DIAGNOSIS — E6609 Other obesity due to excess calories: Secondary | ICD-10-CM

## 2022-09-27 DIAGNOSIS — M5442 Lumbago with sciatica, left side: Secondary | ICD-10-CM | POA: Diagnosis not present

## 2022-09-27 DIAGNOSIS — Z0001 Encounter for general adult medical examination with abnormal findings: Secondary | ICD-10-CM

## 2022-09-27 DIAGNOSIS — Z1231 Encounter for screening mammogram for malignant neoplasm of breast: Secondary | ICD-10-CM

## 2022-09-27 DIAGNOSIS — E782 Mixed hyperlipidemia: Secondary | ICD-10-CM | POA: Diagnosis not present

## 2022-09-27 DIAGNOSIS — Z780 Asymptomatic menopausal state: Secondary | ICD-10-CM | POA: Diagnosis not present

## 2022-09-27 DIAGNOSIS — G8929 Other chronic pain: Secondary | ICD-10-CM

## 2022-09-27 MED ORDER — ASPIRIN 81 MG PO TBEC: 81.0000 mg | DELAYED_RELEASE_TABLET | Freq: Every day | ORAL | 12 refills | Status: AC

## 2022-09-27 NOTE — Progress Notes (Signed)
Subjective:    Patient ID: Tiffany Benton, female    DOB: 10/18/1970, 52 y.o.   MRN: [id number]  HPI  Patient presents to clinic today for her annual exam.  Flu: Never Tetanus: 12/2019 COVID: Never Shingrix: Never Pap smear: 01/2020 Mammogram: 10/2018 Colon screening: 02/2021 Vision screening: annually Dentist: annually  Diet: She does eat lean meat. She consumes fruits and veggies. She tries to avoid fried foods. She drinks mostly sweet tea, water and soda. Exercise: None  Review of Systems     Past Medical History:  Diagnosis Date   Allergy    Frequent headaches    Migraine     Current Outpatient Medications  Medication Sig Dispense Refill   acetaminophen (TYLENOL) 500 MG tablet Take 500 mg by mouth every 6 (six) hours as needed.     Black Cohosh 160 MG CAPS Take by mouth.     fenofibrate (TRICOR) 145 MG tablet TAKE 1 TABLET(145 MG) BY MOUTH DAILY 90 tablet 0   fluticasone (FLONASE) 50 MCG/ACT nasal spray Place 2 sprays into both nostrils daily. 16 g 3   loratadine (CLARITIN) 10 MG tablet Take 1 tablet (10 mg total) by mouth daily. 90 tablet 1   rosuvastatin (CRESTOR) 10 MG tablet TAKE 1 TABLET(10 MG) BY MOUTH DAILY 90 tablet 0   No current facility-administered medications for this visit.    No Known Allergies  Family History  Problem Relation Age of Onset   Breast cancer Mother 4       metastatic   Bone cancer Maternal Grandfather     Social History   Socioeconomic History   Marital status: Divorced    Spouse name: Not on file   Number of children: Not on file   Years of education: Not on file   Highest education level: Not on file  Occupational History   Not on file  Tobacco Use   Smoking status: Every Day    Packs/day: 0.50    Years: 25.00    Total pack years: 12.50    Types: Cigarettes   Smokeless tobacco: Never  Vaping Use   Vaping Use: Never used  Substance and Sexual Activity   Alcohol use: Not Currently    Alcohol/week: 0.0  standard drinks of alcohol   Drug use: No   Sexual activity: Not on file  Other Topics Concern   Not on file  Social History Narrative   Not on file   Social Determinants of Health   Financial Resource Strain: Not on file  Food Insecurity: Not on file  Transportation Needs: Not on file  Physical Activity: Not on file  Stress: Not on file  Social Connections: Not on file  Intimate Partner Violence: Not on file     Constitutional: Denies fever, malaise, fatigue, headache or abrupt weight changes.  HEENT: Denies eye pain, eye redness, ear pain, ringing in the ears, wax buildup, runny nose, nasal congestion, bloody nose, or sore throat. Respiratory: Denies difficulty breathing, shortness of breath, cough or sputum production.   Cardiovascular: Denies chest pain, chest tightness, palpitations or swelling in the hands or feet.  Gastrointestinal: Pt reports intermittent reflux. Denies abdominal pain, bloating, constipation, diarrhea or blood in the stool.  GU: Pt reports amenorrhea. Denies urgency, frequency, pain with urination, burning sensation, blood in urine, odor or discharge. Musculoskeletal: Pt reports chronic back pain. Denies decrease in range of motion, difficulty with gait, or joint swelling.  Skin: Denies redness, rashes, lesions or ulcercations.  Neurological: Pt reports  hot flashes. Denies dizziness, difficulty with memory, difficulty with speech or problems with balance and coordination.  Psych: Denies anxiety, depression, SI/HI.  No other specific complaints in a complete review of systems (except as listed in HPI above).  Objective:   Physical Exam  BP 128/84 (BP Location: Left Arm, Patient Position: Sitting, Cuff Size: Large)   Pulse (!) 108   Temp (!) 96.6 F (35.9 C) (Temporal)   Ht 5' 6"$  (1.676 m)   Wt 186 lb (84.4 kg)   SpO2 98%   BMI 30.02 kg/m   Wt Readings from Last 3 Encounters:  09/27/21 169 lb (76.7 kg)  02/25/21 159 lb (72.1 kg)  01/14/21 157 lb  (71.2 kg)    General: Appears her stated age, obese, in NAD. Skin: Warm, dry and intact. No rashes noted. HEENT: Head: normal shape and size; Eyes: sclera white, no icterus, conjunctiva pink, PERRLA and EOMs intact;  Neck:  Neck supple, trachea midline. No masses, lumps or thyromegaly present.  Cardiovascular: Tachycardic with normal rhythm. S1,S2 noted.  No murmur, rubs or gallops noted. No JVD or BLE edema. No carotid bruits noted. Pulmonary/Chest: Normal effort and positive vesicular breath sounds. No respiratory distress. No wheezes, rales or ronchi noted.  Abdomen: Normal bowel sounds.  Musculoskeletal: Strength 5/5 BUE/BLE. No difficulty with gait.  Neurological: Alert and oriented. Cranial nerves II-XII grossly intact. Coordination normal.  Psychiatric: Mood and affect normal. Behavior is normal. Judgment and thought content normal.    BMET    Component Value Date/Time   NA 139 09/27/2021 0836   K 4.2 09/27/2021 0836   CL 107 09/27/2021 0836   CO2 24 09/27/2021 0836   GLUCOSE 91 09/27/2021 0836   BUN 19 09/27/2021 0836   CREATININE 0.66 09/27/2021 0836   CALCIUM 9.4 09/27/2021 0836   GFRNONAA 105 01/14/2021 1026   GFRAA 121 01/14/2021 1026    Lipid Panel     Component Value Date/Time   CHOL 190 09/27/2021 0836   TRIG 190 (H) 09/27/2021 0836   HDL 47 (L) 09/27/2021 0836   CHOLHDL 4.0 09/27/2021 0836   LDLCALC 112 (H) 09/27/2021 0836    CBC    Component Value Date/Time   WBC 13.2 (H) 09/27/2021 0836   RBC 4.23 09/27/2021 0836   HGB 13.7 09/27/2021 0836   HCT 41.1 09/27/2021 0836   PLT 230 09/27/2021 0836   MCV 97.2 09/27/2021 0836   MCH 32.4 09/27/2021 0836   MCHC 33.3 09/27/2021 0836   RDW 11.8 09/27/2021 0836   LYMPHSABS 2,407 01/10/2020 1045   EOSABS 143 01/10/2020 1045   BASOSABS 71 01/10/2020 1045    Hgb A1C Lab Results  Component Value Date   HGBA1C 5.4 01/14/2021            Assessment & Plan:   Preventative Health  Maintenance:  Encouraged her to get her flu shot in the fall Tetanus UTD Encouraged her to get her COVID-vaccine Discussed Shingrix vaccine, she will check coverage with her insurance company and schedule a visit if she would like to have this done Pap smear UTD Mammogram ordered-she needs to call and schedule this She declines lung cancer screening Colon screening UTD Encouraged her to consume a balanced diet and exercise regimen Advised her seeing eye doctor and dentist annually  RTC in 6 months, follow-up chronic conditions Webb Silversmith, NP

## 2022-09-27 NOTE — Patient Instructions (Signed)
Health Maintenance for Postmenopausal Women Menopause is a normal process in which your ability to get pregnant comes to an end. This process happens slowly over many months or years, usually between the ages of 48 and 55. Menopause is complete when you have missed your menstrual period for 12 months. It is important to talk with your health care provider about some of the most common conditions that affect women after menopause (postmenopausal women). These include heart disease, cancer, and bone loss (osteoporosis). Adopting a healthy lifestyle and getting preventive care can help to promote your health and wellness. The actions you take can also lower your chances of developing some of these common conditions. What are the signs and symptoms of menopause? During menopause, you may have the following symptoms: Hot flashes. These can be moderate or severe. Night sweats. Decrease in sex drive. Mood swings. Headaches. Tiredness (fatigue). Irritability. Memory problems. Problems falling asleep or staying asleep. Talk with your health care provider about treatment options for your symptoms. Do I need hormone replacement therapy? Hormone replacement therapy is effective in treating symptoms that are caused by menopause, such as hot flashes and night sweats. Hormone replacement carries certain risks, especially as you become older. If you are thinking about using estrogen or estrogen with progestin, discuss the benefits and risks with your health care provider. How can I reduce my risk for heart disease and stroke? The risk of heart disease, heart attack, and stroke increases as you age. One of the causes may be a change in the body's hormones during menopause. This can affect how your body uses dietary fats, triglycerides, and cholesterol. Heart attack and stroke are medical emergencies. There are many things that you can do to help prevent heart disease and stroke. Watch your blood pressure High  blood pressure causes heart disease and increases the risk of stroke. This is more likely to develop in people who have high blood pressure readings or are overweight. Have your blood pressure checked: Every 3-5 years if you are 18-39 years of age. Every year if you are 40 years old or older. Eat a healthy diet  Eat a diet that includes plenty of vegetables, fruits, low-fat dairy products, and lean protein. Do not eat a lot of foods that are high in solid fats, added sugars, or sodium. Get regular exercise Get regular exercise. This is one of the most important things you can do for your health. Most adults should: Try to exercise for at least 150 minutes each week. The exercise should increase your heart rate and make you sweat (moderate-intensity exercise). Try to do strengthening exercises at least twice each week. Do these in addition to the moderate-intensity exercise. Spend less time sitting. Even light physical activity can be beneficial. Other tips Work with your health care provider to achieve or maintain a healthy weight. Do not use any products that contain nicotine or tobacco. These products include cigarettes, chewing tobacco, and vaping devices, such as e-cigarettes. If you need help quitting, ask your health care provider. Know your numbers. Ask your health care provider to check your cholesterol and your blood sugar (glucose). Continue to have your blood tested as directed by your health care provider. Do I need screening for cancer? Depending on your health history and family history, you may need to have cancer screenings at different stages of your life. This may include screening for: Breast cancer. Cervical cancer. Lung cancer. Colorectal cancer. What is my risk for osteoporosis? After menopause, you may be   at increased risk for osteoporosis. Osteoporosis is a condition in which bone destruction happens more quickly than new bone creation. To help prevent osteoporosis or  the bone fractures that can happen because of osteoporosis, you may take the following actions: If you are 19-50 years old, get at least 1,000 mg of calcium and at least 600 international units (IU) of vitamin D per day. If you are older than age 50 but younger than age 70, get at least 1,200 mg of calcium and at least 600 international units (IU) of vitamin D per day. If you are older than age 70, get at least 1,200 mg of calcium and at least 800 international units (IU) of vitamin D per day. Smoking and drinking excessive alcohol increase the risk of osteoporosis. Eat foods that are rich in calcium and vitamin D, and do weight-bearing exercises several times each week as directed by your health care provider. How does menopause affect my mental health? Depression may occur at any age, but it is more common as you become older. Common symptoms of depression include: Feeling depressed. Changes in sleep patterns. Changes in appetite or eating patterns. Feeling an overall lack of motivation or enjoyment of activities that you previously enjoyed. Frequent crying spells. Talk with your health care provider if you think that you are experiencing any of these symptoms. General instructions See your health care provider for regular wellness exams and vaccines. This may include: Scheduling regular health, dental, and eye exams. Getting and maintaining your vaccines. These include: Influenza vaccine. Get this vaccine each year before the flu season begins. Pneumonia vaccine. Shingles vaccine. Tetanus, diphtheria, and pertussis (Tdap) booster vaccine. Your health care provider may also recommend other immunizations. Tell your health care provider if you have ever been abused or do not feel safe at home. Summary Menopause is a normal process in which your ability to get pregnant comes to an end. This condition causes hot flashes, night sweats, decreased interest in sex, mood swings, headaches, or lack  of sleep. Treatment for this condition may include hormone replacement therapy. Take actions to keep yourself healthy, including exercising regularly, eating a healthy diet, watching your weight, and checking your blood pressure and blood sugar levels. Get screened for cancer and depression. Make sure that you are up to date with all your vaccines. This information is not intended to replace advice given to you by your health care provider. Make sure you discuss any questions you have with your health care provider. Document Revised: 12/14/2020 Document Reviewed: 12/14/2020 Elsevier Patient Education  2023 Elsevier Inc.  

## 2022-09-27 NOTE — Assessment & Plan Note (Signed)
Encouraged diet and exercise for weight loss ?

## 2022-09-28 ENCOUNTER — Encounter: Payer: Self-pay | Admitting: Internal Medicine

## 2022-09-28 LAB — COMPLETE METABOLIC PANEL WITHOUT GFR
AG Ratio: 1.7 (calc) (ref 1.0–2.5)
ALT: 30 U/L — ABNORMAL HIGH (ref 6–29)
AST: 22 U/L (ref 10–35)
Albumin: 4.7 g/dL (ref 3.6–5.1)
Alkaline phosphatase (APISO): 84 U/L (ref 37–153)
BUN: 17 mg/dL (ref 7–25)
CO2: 22 mmol/L (ref 20–32)
Calcium: 10 mg/dL (ref 8.6–10.4)
Chloride: 105 mmol/L (ref 98–110)
Creat: 0.86 mg/dL (ref 0.50–1.03)
Globulin: 2.7 g/dL (ref 1.9–3.7)
Glucose, Bld: 105 mg/dL — ABNORMAL HIGH (ref 65–99)
Potassium: 4.1 mmol/L (ref 3.5–5.3)
Sodium: 140 mmol/L (ref 135–146)
Total Bilirubin: 0.5 mg/dL (ref 0.2–1.2)
Total Protein: 7.4 g/dL (ref 6.1–8.1)
eGFR: 82 mL/min/{1.73_m2}

## 2022-09-28 LAB — LIPID PANEL
Cholesterol: 222 mg/dL — ABNORMAL HIGH
HDL: 56 mg/dL
LDL Cholesterol (Calc): 125 mg/dL — ABNORMAL HIGH
Non-HDL Cholesterol (Calc): 166 mg/dL — ABNORMAL HIGH
Total CHOL/HDL Ratio: 4 (calc)
Triglycerides: 264 mg/dL — ABNORMAL HIGH

## 2022-09-28 LAB — CBC
HCT: 42.1 % (ref 35.0–45.0)
Hemoglobin: 14.3 g/dL (ref 11.7–15.5)
MCH: 31.7 pg (ref 27.0–33.0)
MCHC: 34 g/dL (ref 32.0–36.0)
MCV: 93.3 fL (ref 80.0–100.0)
MPV: 11 fL (ref 7.5–12.5)
Platelets: 289 10*3/uL (ref 140–400)
RBC: 4.51 10*6/uL (ref 3.80–5.10)
RDW: 12 % (ref 11.0–15.0)
WBC: 12.6 10*3/uL — ABNORMAL HIGH (ref 3.8–10.8)

## 2022-09-28 LAB — HEMOGLOBIN A1C
Hgb A1c MFr Bld: 5.9 % of total Hgb — ABNORMAL HIGH (ref <5.7)
Mean Plasma Glucose: 123 mg/dL
eAG (mmol/L): 6.8 mmol/L

## 2022-09-29 MED ORDER — REPATHA 140 MG/ML ~~LOC~~ SOSY: 140.0000 mg | PREFILLED_SYRINGE | SUBCUTANEOUS | 2 refills | Status: DC

## 2022-09-30 ENCOUNTER — Telehealth: Payer: Self-pay | Admitting: Internal Medicine

## 2022-09-30 NOTE — Telephone Encounter (Signed)
Maria from [place] is calling to see if PCP received the PA for the pt medication Evolocumab (REPATHA) 140 MG/ML SOSY.  Please all Verdis Frederickson back and use reference # [id number]

## 2022-10-03 NOTE — Telephone Encounter (Signed)
PA submitted to Cover My Meds.    Thanks,   -Mickel Baas

## 2022-10-06 NOTE — Telephone Encounter (Addendum)
Marie from [employer] is calling to follow up on PA for the medication Evolocumab (REPATHA) 140 MG/ML SOSY. Lelan Pons is requesting an update.   Reference # [id number]    Please advise.

## 2022-10-07 DIAGNOSIS — Z419 Encounter for procedure for purposes other than remedying health state, unspecified: Secondary | ICD-10-CM | POA: Diagnosis not present

## 2022-10-07 NOTE — Telephone Encounter (Signed)
The appeal has been sent to her insurance.   Thanks,   -Mickel Baas

## 2022-11-07 DIAGNOSIS — Z419 Encounter for procedure for purposes other than remedying health state, unspecified: Secondary | ICD-10-CM | POA: Diagnosis not present

## 2022-11-17 ENCOUNTER — Encounter: Payer: Self-pay | Admitting: Internal Medicine

## 2022-11-23 ENCOUNTER — Telehealth: Payer: Self-pay | Admitting: Internal Medicine

## 2022-11-23 MED ORDER — ROSUVASTATIN CALCIUM 10 MG PO TABS: ORAL_TABLET | ORAL | 0 refills | Status: DC

## 2022-11-23 NOTE — Telephone Encounter (Signed)
Rx for rosuvastatin sent to pharmacy.  At least her taking a low-dose is providing a little benefit better than nothing at all.  Did she check insurance coverage on Praluent?

## 2022-11-23 NOTE — Addendum Note (Signed)
Addended by: Lorre Munroe on: 11/23/2022 02:02 PM   Modules accepted: Orders

## 2022-11-23 NOTE — Addendum Note (Signed)
Addended by: Judd Gaudier on: 11/23/2022 11:40 AM   Modules accepted: Orders

## 2022-11-23 NOTE — Telephone Encounter (Signed)
Informed patient to continue rosuvastatin.  She says this medication causes her to have stomach ache so she only take half a tablet.  Patient was able to tolerate simvastatin.  If she is to stay on rosuvastatin she will need refills.  Please advise.

## 2022-11-23 NOTE — Telephone Encounter (Signed)
Spoke with patient and advised her to take rosuvastatin and contact her insurance about Praluent.

## 2022-11-23 NOTE — Telephone Encounter (Signed)
Continue rosuvastatin.  If her insurance does not cover Repatha, does she know if they will cover Praluent?

## 2022-11-23 NOTE — Telephone Encounter (Signed)
Pt called in states request for the injections for her cholesterol was denied,even on appeal, so she is asking what's next and if Dr Sampson Si is going to keep her on the rosuvastatin (CRESTOR) 10 MG tablet

## 2022-12-07 DIAGNOSIS — Z419 Encounter for procedure for purposes other than remedying health state, unspecified: Secondary | ICD-10-CM | POA: Diagnosis not present

## 2022-12-20 ENCOUNTER — Other Ambulatory Visit: Payer: Self-pay | Admitting: Internal Medicine

## 2022-12-20 DIAGNOSIS — N6002 Solitary cyst of left breast: Secondary | ICD-10-CM

## 2023-01-07 DIAGNOSIS — Z419 Encounter for procedure for purposes other than remedying health state, unspecified: Secondary | ICD-10-CM | POA: Diagnosis not present

## 2023-02-06 DIAGNOSIS — Z419 Encounter for procedure for purposes other than remedying health state, unspecified: Secondary | ICD-10-CM | POA: Diagnosis not present

## 2023-02-26 ENCOUNTER — Other Ambulatory Visit: Payer: Self-pay | Admitting: Internal Medicine

## 2023-02-28 NOTE — Telephone Encounter (Signed)
Requested Prescriptions  Pending Prescriptions Disp Refills   rosuvastatin (CRESTOR) 10 MG tablet [Pharmacy Med Name: ROSUVASTATIN 10MG  TABLETS] 45 tablet 0    Sig: TAKE 1/2 TABLET(5 MG) BY MOUTH EVERY NIGHT AT BEDTIME     Cardiovascular:  Antilipid - Statins 2 Failed - 02/26/2023 10:25 AM      Failed - Lipid Panel in normal range within the last 12 months    Cholesterol  Date Value Ref Range Status  09/27/2022 222 (H) <200 mg/dL Final   LDL Cholesterol (Calc)  Date Value Ref Range Status  09/27/2022 125 (H) mg/dL (calc) Final    Comment:    Reference range: <100 . Desirable range <100 mg/dL for primary prevention;   <70 mg/dL for patients with CHD or diabetic patients  with > or = 2 CHD risk factors. Marland Kitchen LDL-C is now calculated using the Martin-Hopkins  calculation, which is a validated novel method providing  better accuracy than the Friedewald equation in the  estimation of LDL-C.  Horald Pollen et al. Lenox Ahr. 6962;952(84): 2061-2068  (http://education.[facility].com/faq/FAQ164)    HDL  Date Value Ref Range Status  09/27/2022 56 > OR = 50 mg/dL Final   Triglycerides  Date Value Ref Range Status  09/27/2022 264 (H) <150 mg/dL Final    Comment:    . If a non-fasting specimen was collected, consider repeat triglyceride testing on a fasting specimen if clinically indicated.  Perry Mount et al. J. of Clin. Lipidol. 2015;9:129-169. Marland Kitchen          Passed - Cr in normal range and within 360 days    Creat  Date Value Ref Range Status  09/27/2022 0.86 0.50 - 1.03 mg/dL Final         Passed - Patient is not pregnant      Passed - Valid encounter within last 12 months    Recent Outpatient Visits           5 months ago Encounter for general adult medical examination with abnormal findings   [place] [facility] [place], Salvadore Oxford, NP   1 year ago Encounter for general adult medical examination with abnormal findings   [place] [facility]  [place], Salvadore Oxford, NP   2 years ago Encounter for general adult medical examination with abnormal findings   [place] [facility] [place], Salvadore Oxford, NP   3 years ago Annual physical exam   [place] [facility], Jodelle Gross, FNP   3 years ago Tinnitus of both ears    [facility], Jodelle Gross, FNP       Future Appointments             In 3 weeks Baity, Salvadore Oxford, NP  [facility], [facility]

## 2023-03-09 DIAGNOSIS — Z419 Encounter for procedure for purposes other than remedying health state, unspecified: Secondary | ICD-10-CM | POA: Diagnosis not present

## 2023-03-12 ENCOUNTER — Other Ambulatory Visit: Payer: Self-pay | Admitting: Internal Medicine

## 2023-03-13 IMAGING — DX DG SHOULDER 2+V*R*
3 series · 3 of 3 positions shown · non-contrast
Comparison: None.

CLINICAL DATA: Chronic bilateral shoulder pain.

EXAM:
RIGHT SHOULDER - 2+ VIEW

[shoulder ap]
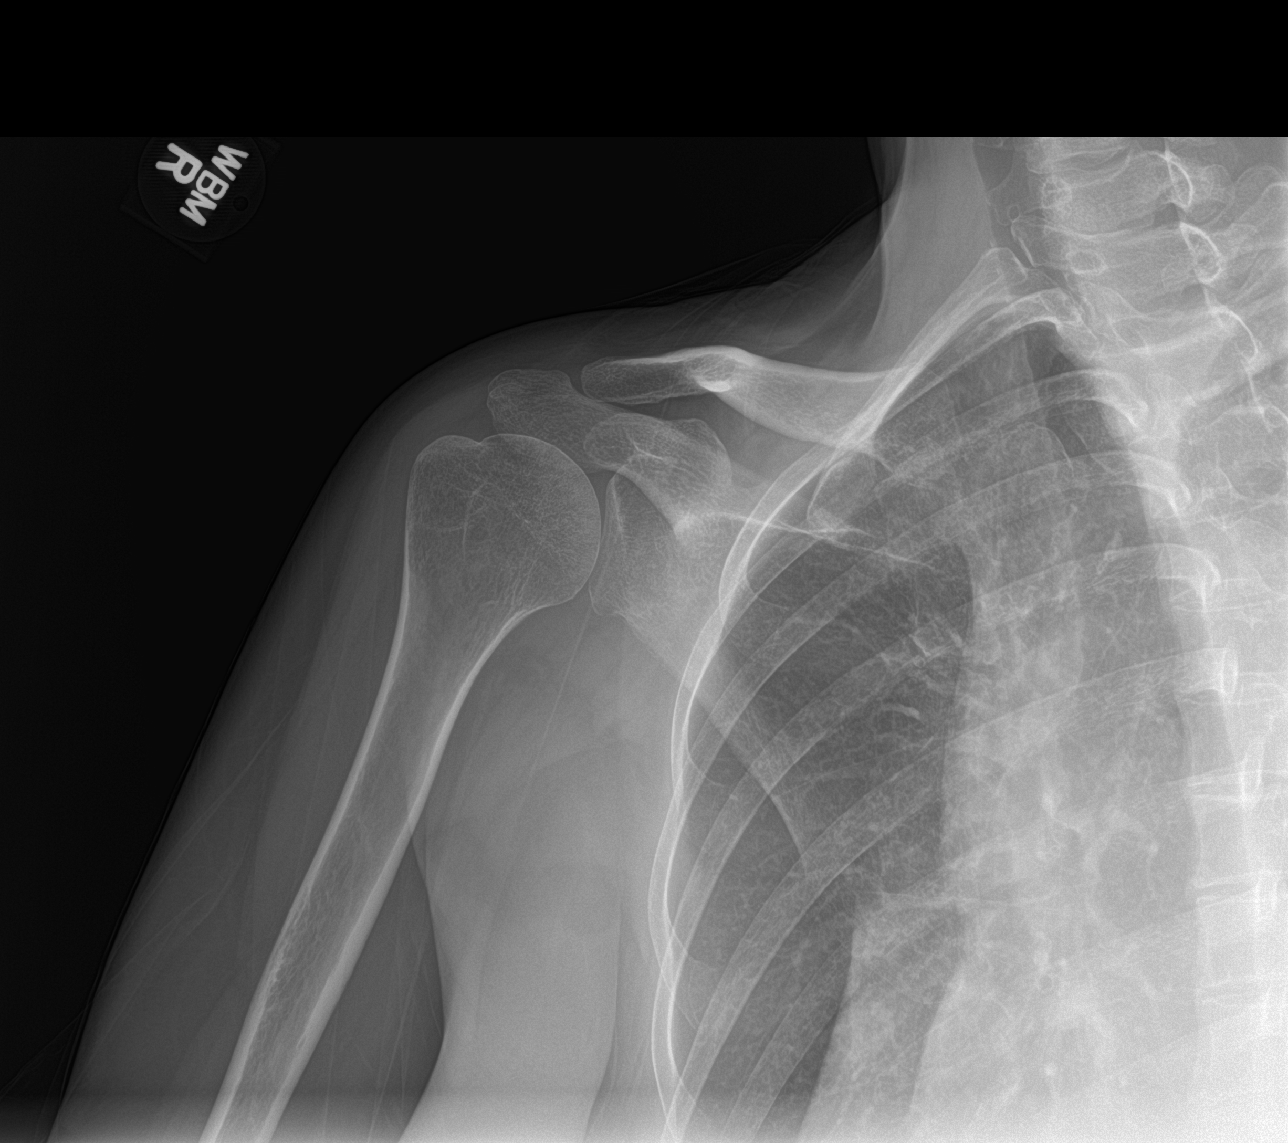

[shoulder y-view]
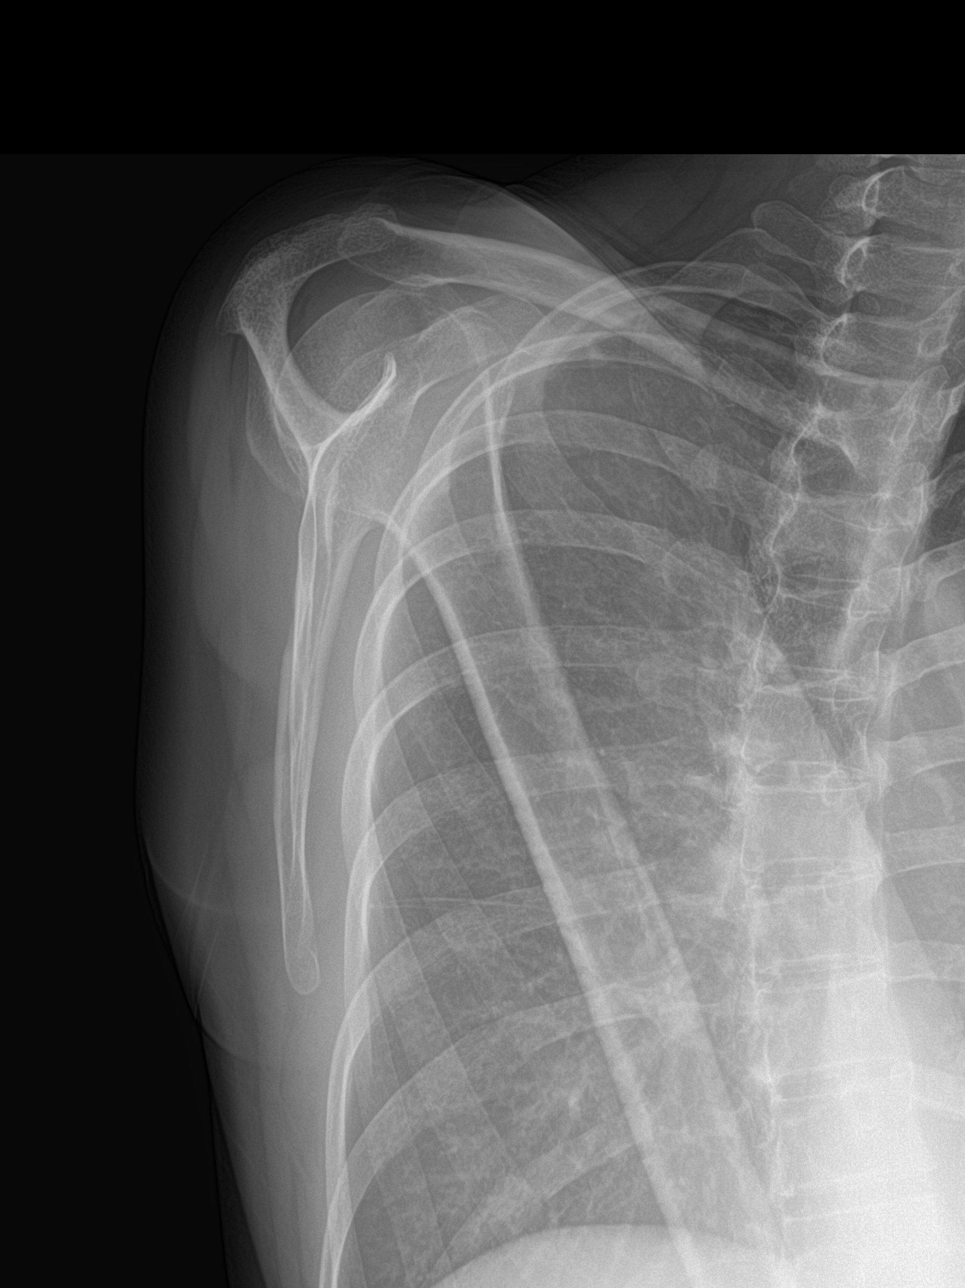

[shoulder axial]
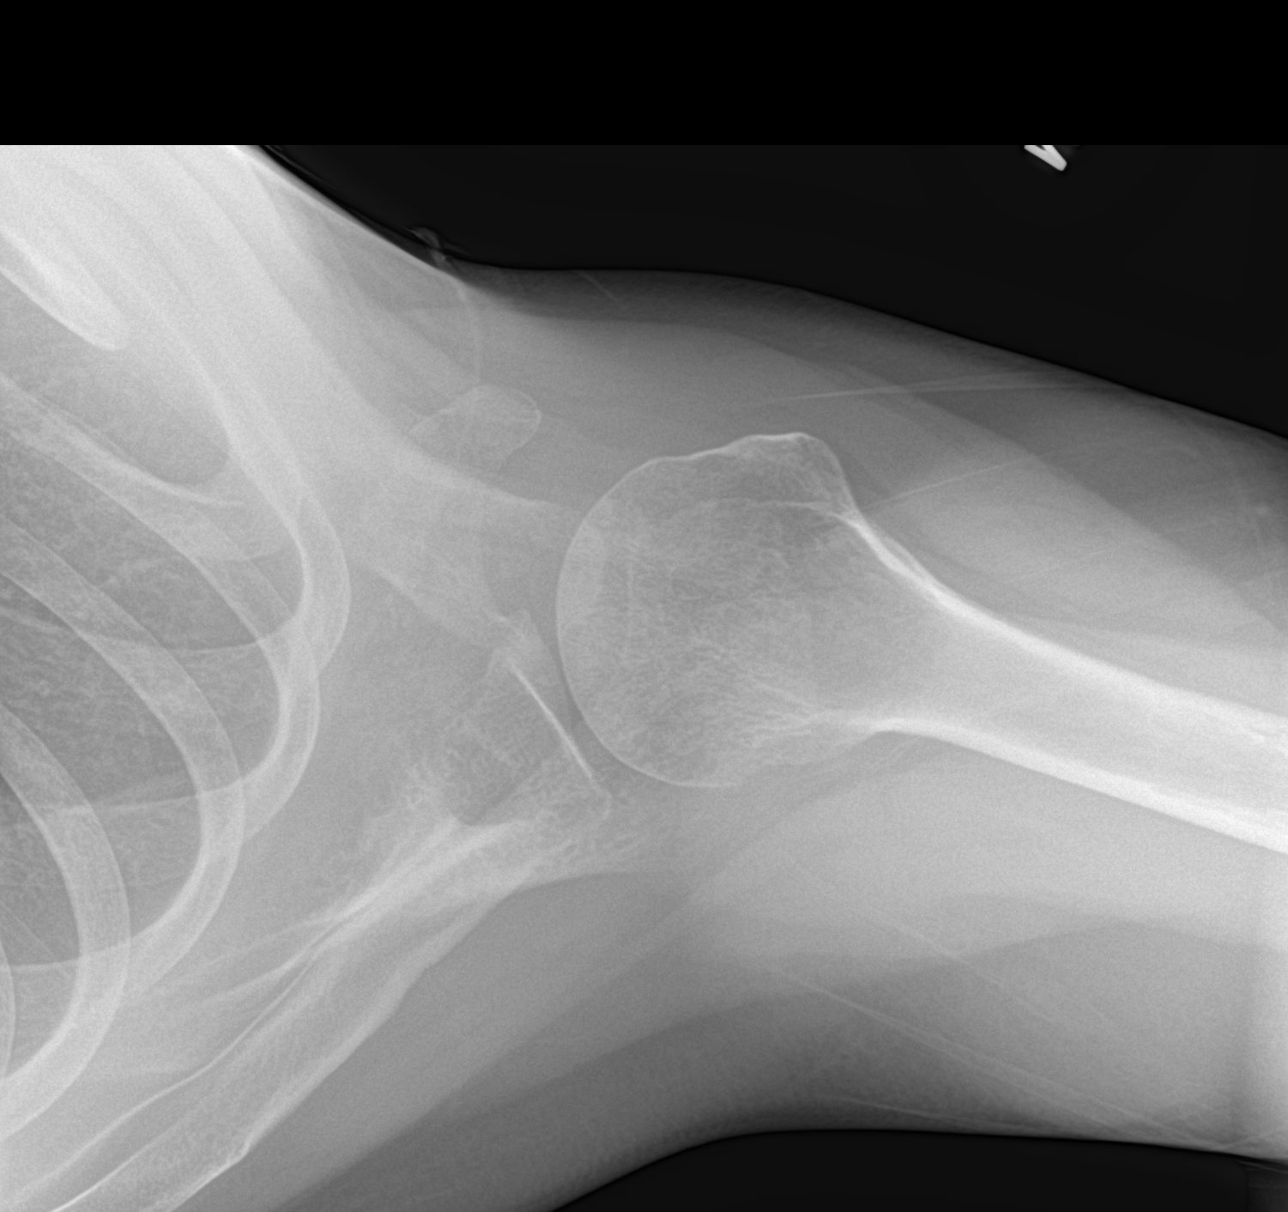

[3 of 3 positions shown; findings below may reference images not displayed]

FINDINGS: There is no evidence of fracture or dislocation. There is no
evidence of arthropathy or other focal bone abnormality. Soft
tissues are unremarkable.
IMPRESSION: Negative.

## 2023-03-13 MED ORDER — ROSUVASTATIN CALCIUM 10 MG PO TABS: ORAL_TABLET | ORAL | 0 refills | Status: DC

## 2023-03-27 ENCOUNTER — Encounter: Payer: Self-pay | Admitting: Internal Medicine

## 2023-03-27 ENCOUNTER — Ambulatory Visit (INDEPENDENT_AMBULATORY_CARE_PROVIDER_SITE_OTHER): Payer: Medicaid Other | Admitting: Internal Medicine

## 2023-03-27 VITALS — BP 134/86 | HR 99 | Temp 95.5°F | Wt 185.0 lb

## 2023-03-27 DIAGNOSIS — M5442 Lumbago with sciatica, left side: Secondary | ICD-10-CM

## 2023-03-27 DIAGNOSIS — R7303 Prediabetes: Secondary | ICD-10-CM

## 2023-03-27 DIAGNOSIS — G8929 Other chronic pain: Secondary | ICD-10-CM

## 2023-03-27 DIAGNOSIS — E782 Mixed hyperlipidemia: Secondary | ICD-10-CM | POA: Diagnosis not present

## 2023-03-27 DIAGNOSIS — E663 Overweight: Secondary | ICD-10-CM

## 2023-03-27 DIAGNOSIS — Z6829 Body mass index (BMI) 29.0-29.9, adult: Secondary | ICD-10-CM

## 2023-03-27 DIAGNOSIS — I700 Atherosclerosis of aorta: Secondary | ICD-10-CM

## 2023-03-27 DIAGNOSIS — Z780 Asymptomatic menopausal state: Secondary | ICD-10-CM | POA: Diagnosis not present

## 2023-03-27 NOTE — Assessment & Plan Note (Signed)
She will try [place] OTC

## 2023-03-27 NOTE — Assessment & Plan Note (Signed)
Encouraged regular stretching and core strengthening Continue Tylenol OTC

## 2023-03-27 NOTE — Patient Instructions (Signed)

## 2023-03-27 NOTE — Assessment & Plan Note (Signed)
C-Met and lipid profile today Encouraged her to consume a low-fat diet Discussed changing rosuvastatin 5 mg to atorvastatin 80 mg as she is unable to tolerate more than 5 mg of rosuvastatin Advised her to continue fenofibrate and start baby aspirin daily Insurance would not cover Repatha

## 2023-03-27 NOTE — Progress Notes (Signed)
Subjective:    Patient ID: Tiffany Benton, female    DOB: 29-Dec-1970, 52 y.o.   MRN: [id number]  HPI  Patient presents to clinic today for 25-month follow-up of chronic conditions.  HLD with aortic atherosclerosis: Her last LDL was 125, triglycerides 264, 09/2022.  She denies myalgias on rosuvastatin and fenofibrate.  She is not taking aspirin as well.  She tries to consume a low-fat diet.  Chronic back pain: s/p surgical intervention. Managed with Tylenol OTC.  There is no imaging on file.  She does not follow with orthopedics.  Prediabetes: Her last A1c was 5.9%, 09/2022.  She is not taking any oral diabetic medication at this time.  She does not check her sugars.  Leukocytosis: Her last WBC count was 12.6, 09/2022.  She does smoke.  She does not follow with hematology.  Review of Systems     Past Medical History:  Diagnosis Date   Allergy    Frequent headaches    Migraine     Current Outpatient Medications  Medication Sig Dispense Refill   acetaminophen (TYLENOL) 500 MG tablet Take 500 mg by mouth every 6 (six) hours as needed.     aspirin EC 81 MG tablet Take 1 tablet (81 mg total) by mouth daily. Swallow whole. 30 tablet 12   Black Cohosh 160 MG CAPS Take by mouth.     fenofibrate (TRICOR) 145 MG tablet TAKE 1 TABLET(145 MG) BY MOUTH DAILY 90 tablet 0   fluticasone (FLONASE) 50 MCG/ACT nasal spray Place 2 sprays into both nostrils daily. 16 g 3   loratadine (CLARITIN) 10 MG tablet Take 1 tablet (10 mg total) by mouth daily. 90 tablet 1   rosuvastatin (CRESTOR) 10 MG tablet TAKE 1/2 TABLET(5 MG) BY MOUTH EVERY NIGHT AT BEDTIME 45 tablet 0   No current facility-administered medications for this visit.    No Known Allergies  Family History  Problem Relation Age of Onset   Breast cancer Mother 69       metastatic   Bone cancer Maternal Grandfather    Colon cancer Neg Hx     Social History   Socioeconomic History   Marital status: Divorced    Spouse name:  Not on file   Number of children: Not on file   Years of education: Not on file   Highest education level: Not on file  Occupational History   Not on file  Tobacco Use   Smoking status: Every Day    Current packs/day: 0.50    Average packs/day: 0.5 packs/day for 25.0 years (12.5 ttl pk-yrs)    Types: Cigarettes   Smokeless tobacco: Never  Vaping Use   Vaping status: Never Used  Substance and Sexual Activity   Alcohol use: Not Currently    Alcohol/week: 0.0 standard drinks of alcohol   Drug use: No   Sexual activity: Not on file  Other Topics Concern   Not on file  Social History Narrative   Not on file   Social Determinants of Health   Financial Resource Strain: Not on file  Food Insecurity: Not on file  Transportation Needs: Not on file  Physical Activity: Not on file  Stress: Not on file  Social Connections: Not on file  Intimate Partner Violence: Not on file     Constitutional: Denies fever, malaise, fatigue, headache or abrupt weight changes.  HEENT: Denies eye pain, eye redness, ear pain, ringing in the ears, wax buildup, runny nose, nasal congestion, bloody nose, or sore  throat. Respiratory: Denies difficulty breathing, shortness of breath, cough or sputum production.   Cardiovascular: Denies chest pain, chest tightness, palpitations or swelling in the hands or feet.  Gastrointestinal: Denies abdominal pain, bloating, constipation, diarrhea or blood in the stool.  GU: Denies urgency, frequency, pain with urination, burning sensation, blood in urine, odor or discharge. Musculoskeletal: Patient reports chronic back pain.  Denies decrease in range of motion, difficulty with gait, or joint swelling.  Skin: Denies redness, rashes, lesions or ulcercations.  Neurological: Pt reports hot flashes. Denies dizziness, difficulty with memory, difficulty with speech or problems with balance and coordination.  Psych: Denies anxiety, depression, SI/HI.  No other specific  complaints in a complete review of systems (except as listed in HPI above).  Objective:   Physical Exam  BP 134/86 (BP Location: Left Arm, Patient Position: Sitting, Cuff Size: Normal)   Pulse 99   Temp (!) 95.5 F (35.3 C) (Temporal)   Wt 185 lb (83.9 kg)   SpO2 99%   BMI 29.86 kg/m    Wt Readings from Last 3 Encounters:  09/27/22 186 lb (84.4 kg)  09/27/21 169 lb (76.7 kg)  02/25/21 159 lb (72.1 kg)    General: Appears her stated age, overweight, in NAD. Skin: Warm, dry and intact.  HEENT: Head: normal shape and size; Eyes: sclera white, no icterus, conjunctiva pink, PERRLA and EOMs intact;  Cardiovascular: Normal rate and rhythm. S1,S2 noted.  No murmur, rubs or gallops noted. No JVD or BLE edema. No carotid bruits noted. Pulmonary/Chest: Normal effort and positive vesicular breath sounds. No respiratory distress. No wheezes, rales or ronchi noted.  Musculoskeletal: No difficulty with gait.  Neurological: Alert and oriented. Coordination normal.  Psychiatric: Mood and affect normal. Behavior is normal. Judgment and thought content normal.     BMET    Component Value Date/Time   NA 140 09/27/2022 0819   K 4.1 09/27/2022 0819   CL 105 09/27/2022 0819   CO2 22 09/27/2022 0819   GLUCOSE 105 (H) 09/27/2022 0819   BUN 17 09/27/2022 0819   CREATININE 0.86 09/27/2022 0819   CALCIUM 10.0 09/27/2022 0819   GFRNONAA 105 01/14/2021 1026   GFRAA 121 01/14/2021 1026    Lipid Panel     Component Value Date/Time   CHOL 222 (H) 09/27/2022 0819   TRIG 264 (H) 09/27/2022 0819   HDL 56 09/27/2022 0819   CHOLHDL 4.0 09/27/2022 0819   LDLCALC 125 (H) 09/27/2022 0819    CBC    Component Value Date/Time   WBC 12.6 (H) 09/27/2022 0819   RBC 4.51 09/27/2022 0819   HGB 14.3 09/27/2022 0819   HCT 42.1 09/27/2022 0819   PLT 289 09/27/2022 0819   MCV 93.3 09/27/2022 0819   MCH 31.7 09/27/2022 0819   MCHC 34.0 09/27/2022 0819   RDW 12.0 09/27/2022 0819   LYMPHSABS 2,407  01/10/2020 1045   EOSABS 143 01/10/2020 1045   BASOSABS 71 01/10/2020 1045    Hgb A1C Lab Results  Component Value Date   HGBA1C 5.9 (H) 09/27/2022           Assessment & Plan:      RTC in 6 months for annual exam Nicki Reaper, NP

## 2023-03-27 NOTE — Assessment & Plan Note (Signed)
A1c today Encourage low-carb diet 

## 2023-03-27 NOTE — Assessment & Plan Note (Signed)
Encourage diet and exercise for weight loss 

## 2023-03-28 ENCOUNTER — Encounter: Payer: Self-pay | Admitting: Internal Medicine

## 2023-03-28 LAB — COMPLETE METABOLIC PANEL WITH GFR
AG Ratio: 2.1 (calc) (ref 1.0–2.5)
ALT: 33 U/L — ABNORMAL HIGH (ref 6–29)
AST: 26 U/L (ref 10–35)
Albumin: 4.8 g/dL (ref 3.6–5.1)
Alkaline phosphatase (APISO): 85 U/L (ref 37–153)
BUN: 15 mg/dL (ref 7–25)
CO2: 23 mmol/L (ref 20–32)
Calcium: 9.8 mg/dL (ref 8.6–10.4)
Chloride: 108 mmol/L (ref 98–110)
Creat: 0.77 mg/dL (ref 0.50–1.03)
Globulin: 2.3 g/dL (calc) (ref 1.9–3.7)
Glucose, Bld: 110 mg/dL — ABNORMAL HIGH (ref 65–99)
Potassium: 4.5 mmol/L (ref 3.5–5.3)
Sodium: 141 mmol/L (ref 135–146)
Total Bilirubin: 0.3 mg/dL (ref 0.2–1.2)
Total Protein: 7.1 g/dL (ref 6.1–8.1)
eGFR: 93 mL/min/{1.73_m2} (ref >=60)

## 2023-03-28 LAB — LIPID PANEL
Cholesterol: 263 mg/dL — ABNORMAL HIGH (ref <200)
HDL: 49 mg/dL — ABNORMAL LOW (ref >=50)
LDL Cholesterol (Calc): 164 mg/dL — ABNORMAL HIGH
Non-HDL Cholesterol (Calc): 214 mg/dL — ABNORMAL HIGH (ref <130)
Total CHOL/HDL Ratio: 5.4 (calc) — ABNORMAL HIGH (ref <5.0)
Triglycerides: 327 mg/dL — ABNORMAL HIGH (ref <150)

## 2023-03-28 LAB — HEMOGLOBIN A1C
Hgb A1c MFr Bld: 5.9 %{Hb} — ABNORMAL HIGH (ref <5.7)
Mean Plasma Glucose: 123 mg/dL
eAG (mmol/L): 6.8 mmol/L

## 2023-03-28 LAB — CBC
HCT: 42.5 % (ref 35.0–45.0)
Hemoglobin: 14.4 g/dL (ref 11.7–15.5)
MCH: 31.9 pg (ref 27.0–33.0)
MCHC: 33.9 g/dL (ref 32.0–36.0)
MCV: 94 fL (ref 80.0–100.0)
MPV: 11.3 fL (ref 7.5–12.5)
Platelets: 263 10*3/uL (ref 140–400)
RBC: 4.52 10*6/uL (ref 3.80–5.10)
RDW: 12.4 % (ref 11.0–15.0)
WBC: 11.1 10*3/uL — ABNORMAL HIGH (ref 3.8–10.8)

## 2023-03-29 MED ORDER — ATORVASTATIN CALCIUM 80 MG PO TABS: 80.0000 mg | ORAL_TABLET | Freq: Every day | ORAL | 0 refills | Status: DC

## 2023-03-29 MED ORDER — FENOFIBRATE 145 MG PO TABS: 145.0000 mg | ORAL_TABLET | Freq: Every day | ORAL | 1 refills | Status: DC

## 2023-03-29 NOTE — Addendum Note (Signed)
Addended by: Lorre Munroe on: 03/29/2023 10:43 AM   Modules accepted: Orders

## 2023-04-09 DIAGNOSIS — Z419 Encounter for procedure for purposes other than remedying health state, unspecified: Secondary | ICD-10-CM | POA: Diagnosis not present

## 2023-05-09 DIAGNOSIS — Z419 Encounter for procedure for purposes other than remedying health state, unspecified: Secondary | ICD-10-CM | POA: Diagnosis not present

## 2023-06-09 DIAGNOSIS — Z419 Encounter for procedure for purposes other than remedying health state, unspecified: Secondary | ICD-10-CM | POA: Diagnosis not present

## 2023-07-05 ENCOUNTER — Other Ambulatory Visit: Payer: Self-pay | Admitting: Internal Medicine

## 2023-07-05 MED ORDER — ATORVASTATIN CALCIUM 80 MG PO TABS: 80.0000 mg | ORAL_TABLET | Freq: Every day | ORAL | 0 refills | Status: DC

## 2023-07-07 ENCOUNTER — Other Ambulatory Visit: Payer: Self-pay | Admitting: Internal Medicine

## 2023-07-09 DIAGNOSIS — Z419 Encounter for procedure for purposes other than remedying health state, unspecified: Secondary | ICD-10-CM | POA: Diagnosis not present

## 2023-07-11 NOTE — Telephone Encounter (Signed)
Disp Refills Start End   atorvastatin (LIPITOR) 80 MG tablet 90 tablet 0 07/05/2023 --   Sig - Route: Take 1 tablet (80 mg total) by mouth daily. - Oral   Sent to pharmacy as: atorvastatin (LIPITOR) 80 MG tablet   E-Prescribing Status: Receipt confirmed by pharmacy (07/05/2023  1:01 PM EST)     Requested Prescriptions  Pending Prescriptions Disp Refills   atorvastatin (LIPITOR) 80 MG tablet [Pharmacy Med Name: ATORVASTATIN 80MG  TABLETS] 90 tablet 0    Sig: TAKE 1 TABLET(80 MG) BY MOUTH DAILY     Cardiovascular:  Antilipid - Statins Failed - 07/07/2023 12:04 PM      Failed - Lipid Panel in normal range within the last 12 months    Cholesterol  Date Value Ref Range Status  03/27/2023 263 (H) <200 mg/dL Final   LDL Cholesterol (Calc)  Date Value Ref Range Status  03/27/2023 164 (H) mg/dL (calc) Final    Comment:    Reference range: <100 . Desirable range <100 mg/dL for primary prevention;   <70 mg/dL for patients with CHD or diabetic patients  with > or = 2 CHD risk factors. Marland Kitchen LDL-C is now calculated using the Martin-Hopkins  calculation, which is a validated novel method providing  better accuracy than the Friedewald equation in the  estimation of LDL-C.  Horald Pollen et al. Lenox Ahr. 1610;960(45): 2061-2068  (http://education.[facility].com/faq/FAQ164)    HDL  Date Value Ref Range Status  03/27/2023 49 (L) > OR = 50 mg/dL Final   Triglycerides  Date Value Ref Range Status  03/27/2023 327 (H) <150 mg/dL Final    Comment:    . If a non-fasting specimen was collected, consider repeat triglyceride testing on a fasting specimen if clinically indicated.  Perry Mount et al. J. of Clin. Lipidol. 2015;9:129-169. Marland Kitchen          Passed - Patient is not pregnant      Passed - Valid encounter within last 12 months    Recent Outpatient Visits           3 months ago Mixed hyperlipidemia   [place] [facility] [place], Salvadore Oxford, NP   9 months ago Encounter for  general adult medical examination with abnormal findings   [place] [facility] [place], Salvadore Oxford, NP   1 year ago Encounter for general adult medical examination with abnormal findings   [place] [facility] [place], Salvadore Oxford, NP   2 years ago Encounter for general adult medical examination with abnormal findings   [place] [facility] [place], Salvadore Oxford, NP   3 years ago Annual physical exam   [place] [facility], Jodelle Gross, FNP       Future Appointments             In 2 months [place], Salvadore Oxford, NP [place] [facility], [facility]

## 2023-08-09 DIAGNOSIS — Z419 Encounter for procedure for purposes other than remedying health state, unspecified: Secondary | ICD-10-CM | POA: Diagnosis not present

## 2023-09-09 DIAGNOSIS — Z419 Encounter for procedure for purposes other than remedying health state, unspecified: Secondary | ICD-10-CM | POA: Diagnosis not present

## 2023-10-02 ENCOUNTER — Ambulatory Visit (INDEPENDENT_AMBULATORY_CARE_PROVIDER_SITE_OTHER): Payer: Medicaid Other | Admitting: Internal Medicine

## 2023-10-02 ENCOUNTER — Encounter: Payer: Self-pay | Admitting: Internal Medicine

## 2023-10-02 VITALS — BP 132/82 | Ht 66.0 in | Wt 188.4 lb

## 2023-10-02 DIAGNOSIS — R7303 Prediabetes: Secondary | ICD-10-CM | POA: Diagnosis not present

## 2023-10-02 DIAGNOSIS — K219 Gastro-esophageal reflux disease without esophagitis: Secondary | ICD-10-CM | POA: Diagnosis not present

## 2023-10-02 DIAGNOSIS — E782 Mixed hyperlipidemia: Secondary | ICD-10-CM

## 2023-10-02 DIAGNOSIS — K5901 Slow transit constipation: Secondary | ICD-10-CM

## 2023-10-02 DIAGNOSIS — Z0001 Encounter for general adult medical examination with abnormal findings: Secondary | ICD-10-CM | POA: Diagnosis not present

## 2023-10-02 DIAGNOSIS — R052 Subacute cough: Secondary | ICD-10-CM

## 2023-10-02 DIAGNOSIS — R0989 Other specified symptoms and signs involving the circulatory and respiratory systems: Secondary | ICD-10-CM | POA: Diagnosis not present

## 2023-10-02 MED ORDER — OMEPRAZOLE 20 MG PO CPDR: 20.0000 mg | DELAYED_RELEASE_CAPSULE | Freq: Every day | ORAL | 1 refills | Status: DC

## 2023-10-02 NOTE — Patient Instructions (Signed)
 Health Maintenance for Postmenopausal Women Menopause is a normal process in which your ability to get pregnant comes to an end. This process happens slowly over many months or years, usually between the ages of 24 and 62. Menopause is complete when you have missed your menstrual period for 12 months. It is important to talk with your health care provider about some of the most common conditions that affect women after menopause (postmenopausal women). These include heart disease, cancer, and bone loss (osteoporosis). Adopting a healthy lifestyle and getting preventive care can help to promote your health and wellness. The actions you take can also lower your chances of developing some of these common conditions. What are the signs and symptoms of menopause? During menopause, you may have the following symptoms: Hot flashes. These can be moderate or severe. Night sweats. Decrease in sex drive. Mood swings. Headaches. Tiredness (fatigue). Irritability. Memory problems. Problems falling asleep or staying asleep. Talk with your health care provider about treatment options for your symptoms. Do I need hormone replacement therapy? Hormone replacement therapy is effective in treating symptoms that are caused by menopause, such as hot flashes and night sweats. Hormone replacement carries certain risks, especially as you become older. If you are thinking about using estrogen or estrogen with progestin, discuss the benefits and risks with your health care provider. How can I reduce my risk for heart disease and stroke? The risk of heart disease, heart attack, and stroke increases as you age. One of the causes may be a change in the body's hormones during menopause. This can affect how your body uses dietary fats, triglycerides, and cholesterol. Heart attack and stroke are medical emergencies. There are many things that you can do to help prevent heart disease and stroke. Watch your blood pressure High  blood pressure causes heart disease and increases the risk of stroke. This is more likely to develop in people who have high blood pressure readings or are overweight. Have your blood pressure checked: Every 3-5 years if you are 50-75 years of age. Every year if you are 77 years old or older. Eat a healthy diet  Eat a diet that includes plenty of vegetables, fruits, low-fat dairy products, and lean protein. Do not eat a lot of foods that are high in solid fats, added sugars, or sodium. Get regular exercise Get regular exercise. This is one of the most important things you can do for your health. Most adults should: Try to exercise for at least 150 minutes each week. The exercise should increase your heart rate and make you sweat (moderate-intensity exercise). Try to do strengthening exercises at least twice each week. Do these in addition to the moderate-intensity exercise. Spend less time sitting. Even light physical activity can be beneficial. Other tips Work with your health care provider to achieve or maintain a healthy weight. Do not use any products that contain nicotine or tobacco. These products include cigarettes, chewing tobacco, and vaping devices, such as e-cigarettes. If you need help quitting, ask your health care provider. Know your numbers. Ask your health care provider to check your cholesterol and your blood sugar (glucose). Continue to have your blood tested as directed by your health care provider. Do I need screening for cancer? Depending on your health history and family history, you may need to have cancer screenings at different stages of your life. This may include screening for: Breast cancer. Cervical cancer. Lung cancer. Colorectal cancer. What is my risk for osteoporosis? After menopause, you may be  at increased risk for osteoporosis. Osteoporosis is a condition in which bone destruction happens more quickly than new bone creation. To help prevent osteoporosis or  the bone fractures that can happen because of osteoporosis, you may take the following actions: If you are 61-3 years old, get at least 1,000 mg of calcium and at least 600 international units (IU) of vitamin D per day. If you are older than age 61 but younger than age 75, get at least 1,200 mg of calcium and at least 600 international units (IU) of vitamin D per day. If you are older than age 62, get at least 1,200 mg of calcium and at least 800 international units (IU) of vitamin D per day. Smoking and drinking excessive alcohol increase the risk of osteoporosis. Eat foods that are rich in calcium and vitamin D, and do weight-bearing exercises several times each week as directed by your health care provider. How does menopause affect my mental health? Depression may occur at any age, but it is more common as you become older. Common symptoms of depression include: Feeling depressed. Changes in sleep patterns. Changes in appetite or eating patterns. Feeling an overall lack of motivation or enjoyment of activities that you previously enjoyed. Frequent crying spells. Talk with your health care provider if you think that you are experiencing any of these symptoms. General instructions See your health care provider for regular wellness exams and vaccines. This may include: Scheduling regular health, dental, and eye exams. Getting and maintaining your vaccines. These include: Influenza vaccine. Get this vaccine each year before the flu season begins. Pneumonia vaccine. Shingles vaccine. Tetanus, diphtheria, and pertussis (Tdap) booster vaccine. Your health care provider may also recommend other immunizations. Tell your health care provider if you have ever been abused or do not feel safe at home. Summary Menopause is a normal process in which your ability to get pregnant comes to an end. This condition causes hot flashes, night sweats, decreased interest in sex, mood swings, headaches, or lack  of sleep. Treatment for this condition may include hormone replacement therapy. Take actions to keep yourself healthy, including exercising regularly, eating a healthy diet, watching your weight, and checking your blood pressure and blood sugar levels. Get screened for cancer and depression. Make sure that you are up to date with all your vaccines. This information is not intended to replace advice given to you by your health care provider. Make sure you discuss any questions you have with your health care provider. Document Revised: 12/14/2020 Document Reviewed: 12/14/2020 Elsevier Patient Education  2024 [employer].

## 2023-10-02 NOTE — Progress Notes (Signed)
 Subjective:    Patient ID: Tiffany Benton, female    DOB: [birth year], 53 y.o.   MRN: [id number]  HPI  Patient presents to clinic today for her annual exam.  Flu: Never Tetanus: 12/2019 COVID: Never Pneumovax: Never Shingrix: Never Pap smear: 01/2020 Mammogram: 10/2018 Colon screening: 02/2021 Vision screening: annually Dentist: annually  Diet: She does eat lean meat. She consumes fruits and veggies. She tries to avoid fried foods. She drinks mostly sweet tea, water and soda. Exercise: None  Review of Systems     Past Medical History:  Diagnosis Date   Allergy    Frequent headaches    Migraine     Current Outpatient Medications  Medication Sig Dispense Refill   acetaminophen (TYLENOL) 500 MG tablet Take 500 mg by mouth every 6 (six) hours as needed.     aspirin EC 81 MG tablet Take 1 tablet (81 mg total) by mouth daily. Swallow whole. 30 tablet 12   atorvastatin (LIPITOR) 80 MG tablet Take 1 tablet (80 mg total) by mouth daily. 90 tablet 0   fenofibrate (TRICOR) 145 MG tablet Take 1 tablet (145 mg total) by mouth daily. 90 tablet 1   fluticasone (FLONASE) 50 MCG/ACT nasal spray Place 2 sprays into both nostrils daily. 16 g 3   loratadine (CLARITIN) 10 MG tablet Take 1 tablet (10 mg total) by mouth daily. 90 tablet 1   No current facility-administered medications for this visit.    No Known Allergies  Family History  Problem Relation Age of Onset   Breast cancer Mother 66       metastatic   Bone cancer Maternal Grandfather    Colon cancer Neg Hx     Social History   Socioeconomic History   Marital status: Divorced    Spouse name: Not on file   Number of children: Not on file   Years of education: Not on file   Highest education level: GED or equivalent  Occupational History   Not on file  Tobacco Use   Smoking status: Every Day    Current packs/day: 0.50    Average packs/day: 0.5 packs/day for 25.0 years (12.5 ttl pk-yrs)    Types: Cigarettes    Smokeless tobacco: Never  Vaping Use   Vaping status: Never Used  Substance and Sexual Activity   Alcohol use: Not Currently    Alcohol/week: 0.0 standard drinks of alcohol   Drug use: No   Sexual activity: Not on file  Other Topics Concern   Not on file  Social History Narrative   Not on file   Social Drivers of Health   Financial Resource Strain: Patient Declined (09/28/2023)   Overall Financial Resource Strain (CARDIA)    Difficulty of Paying Living Expenses: Patient declined  Food Insecurity: No Food Insecurity (09/28/2023)   Hunger Vital Sign    Worried About Running Out of Food in the Last Year: Never true    Ran Out of Food in the Last Year: Never true  Transportation Needs: No Transportation Needs (09/28/2023)   PRAPARE - Administrator, Civil Service (Medical): No    Lack of Transportation (Non-Medical): No  Physical Activity: Unknown (09/28/2023)   Exercise Vital Sign    Days of Exercise per Week: 0 days    Minutes of Exercise per Session: Not on file  Stress: No Stress Concern Present (09/28/2023)   [employer] of Occupational Health - Occupational Stress Questionnaire    Feeling of Stress : Only a  little  Social Connections: Socially Isolated (09/28/2023)   Social Connection and Isolation Panel [NHANES]    Frequency of Communication with Friends and Family: Once a week    Frequency of Social Gatherings with Friends and Family: Once a week    Attends Religious Services: 1 to 4 times per year    Active Member of [employer] or Organizations: No    Attends Engineer, structural: Not on file    Marital Status: Divorced  Intimate Partner Violence: Not on file     Constitutional: Denies fever, malaise, fatigue, headache or abrupt weight changes.  HEENT: Denies eye pain, eye redness, ear pain, ringing in the ears, wax buildup, runny nose, nasal congestion, bloody nose, or sore throat. Respiratory: Patient reports chest congestion and cough.  Denies  difficulty breathing, shortness of breath, or sputum production.   Cardiovascular: Denies chest pain, chest tightness, palpitations or swelling in the hands or feet.  Gastrointestinal: Pt reports intermittent reflux and constipation. Denies abdominal pain, bloating, diarrhea or blood in the stool.  GU:  Denies urgency, frequency, pain with urination, burning sensation, blood in urine, odor or discharge. Musculoskeletal: Pt reports chronic back pain. Denies decrease in range of motion, difficulty with gait, or joint swelling.  Skin: Denies redness, rashes, lesions or ulcercations.  Neurological: Pt reports hot flashes. Denies dizziness, difficulty with memory, difficulty with speech or problems with balance and coordination.  Psych: Denies anxiety, depression, SI/HI.  No other specific complaints in a complete review of systems (except as listed in HPI above).  Objective:   Physical Exam  BP 132/82 (BP Location: Left Arm, Patient Position: Sitting, Cuff Size: Normal)   Ht 5\' 6"  (1.676 m)   Wt 188 lb 6.4 oz (85.5 kg)   LMP 12/24/2020 (Approximate) Comment: tubal  BMI 30.41 kg/m    Wt Readings from Last 3 Encounters:  03/27/23 185 lb (83.9 kg)  09/27/22 186 lb (84.4 kg)  09/27/21 169 lb (76.7 kg)    General: Appears her stated age, obese, in NAD. Skin: Warm, dry and intact. No rashes noted. HEENT: Head: normal shape and size; Eyes: sclera white, no icterus, conjunctiva pink, PERRLA and EOMs intact;  Neck:  Neck supple, trachea midline. No masses, lumps or thyromegaly present.  Cardiovascular: Tachycardic with normal rhythm. S1,S2 noted.  No murmur, rubs or gallops noted. No JVD or BLE edema. No carotid bruits noted. Pulmonary/Chest: Normal effort and positive vesicular breath sounds. No respiratory distress. No wheezes, rales or ronchi noted.  Abdomen: Normal bowel sounds.  Musculoskeletal: Strength 5/5 BUE/BLE. No difficulty with gait.  Neurological: Alert and oriented. Cranial  nerves II-XII grossly intact. Coordination normal.  Psychiatric: Mood and affect normal. Behavior is normal. Judgment and thought content normal.    BMET    Component Value Date/Time   NA 141 03/27/2023 0826   K 4.5 03/27/2023 0826   CL 108 03/27/2023 0826   CO2 23 03/27/2023 0826   GLUCOSE 110 (H) 03/27/2023 0826   BUN 15 03/27/2023 0826   CREATININE 0.77 03/27/2023 0826   CALCIUM 9.8 03/27/2023 0826   GFRNONAA 105 01/14/2021 1026   GFRAA 121 01/14/2021 1026    Lipid Panel     Component Value Date/Time   CHOL 263 (H) 03/27/2023 0826   TRIG 327 (H) 03/27/2023 0826   HDL 49 (L) 03/27/2023 0826   CHOLHDL 5.4 (H) 03/27/2023 0826   LDLCALC 164 (H) 03/27/2023 0826    CBC    Component Value Date/Time   WBC 11.1 (  H) 03/27/2023 0826   RBC 4.52 03/27/2023 0826   HGB 14.4 03/27/2023 0826   HCT 42.5 03/27/2023 0826   PLT 263 03/27/2023 0826   MCV 94.0 03/27/2023 0826   MCH 31.9 03/27/2023 0826   MCHC 33.9 03/27/2023 0826   RDW 12.4 03/27/2023 0826   LYMPHSABS 2,407 01/10/2020 1045   EOSABS 143 01/10/2020 1045   BASOSABS 71 01/10/2020 1045    Hgb A1C Lab Results  Component Value Date   HGBA1C 5.9 (H) 03/27/2023            Assessment & Plan:   Preventative Health Maintenance:  She declines flu shot Tetanus UTD Encouraged her to get her COVID-vaccine She declines Pneumovax Discussed Shingrix vaccine, she will check coverage with her insurance company and schedule a visit if she would like to have this done Pap smear UTD Mammogram ordered previously, she just needs to call and schedule this She declines lung cancer screening Colon screening UTD Encouraged her to consume a balanced diet and exercise regimen Advised her seeing eye doctor and dentist annually We will check CBC, c-Met, lipid, A1c today  Cough and chest congestion:  She reports symptoms of a recent viral URI with residual cough Lung exam benign Recommend Mucinex 600 to 1200 mg every 12 hours  as needed Encourage smoking cessation  Constipation:  Recommend high-fiber diet and adequate water intake Recommend Metamucil or Benefiber OTC If not effective, would consider MiraLAX 17 g p.o. daily  GERD:  Will start omeprazole 20 mg daily in a.m. Encourage weight loss as this can help reduce reflux symptoms Try to identify and avoid foods that trigger reflux  RTC in 6 months, follow-up chronic conditions Nicki Reaper, NP

## 2023-10-03 ENCOUNTER — Encounter: Payer: Self-pay | Admitting: Internal Medicine

## 2023-10-03 LAB — COMPLETE METABOLIC PANEL WITH GFR
AG Ratio: 1.8 (calc) (ref 1.0–2.5)
ALT: 35 U/L — ABNORMAL HIGH (ref 6–29)
AST: 25 U/L (ref 10–35)
Albumin: 4.3 g/dL (ref 3.6–5.1)
Alkaline phosphatase (APISO): 52 U/L (ref 37–153)
BUN: 18 mg/dL (ref 7–25)
CO2: 25 mmol/L (ref 20–32)
Calcium: 9.9 mg/dL (ref 8.6–10.4)
Chloride: 108 mmol/L (ref 98–110)
Creat: 1 mg/dL (ref 0.50–1.03)
Globulin: 2.4 g/dL (ref 1.9–3.7)
Glucose, Bld: 110 mg/dL — ABNORMAL HIGH (ref 65–99)
Potassium: 4.2 mmol/L (ref 3.5–5.3)
Sodium: 141 mmol/L (ref 135–146)
Total Bilirubin: 0.4 mg/dL (ref 0.2–1.2)
Total Protein: 6.7 g/dL (ref 6.1–8.1)
eGFR: 68 mL/min/{1.73_m2} (ref >=60)

## 2023-10-03 LAB — CBC
HCT: 40 % (ref 35.0–45.0)
Hemoglobin: 13.2 g/dL (ref 11.7–15.5)
MCH: 31.3 pg (ref 27.0–33.0)
MCHC: 33 g/dL (ref 32.0–36.0)
MCV: 94.8 fL (ref 80.0–100.0)
MPV: 12.3 fL (ref 7.5–12.5)
Platelets: 284 10*3/uL (ref 140–400)
RBC: 4.22 10*6/uL (ref 3.80–5.10)
RDW: 12 % (ref 11.0–15.0)
WBC: 13.2 10*3/uL — ABNORMAL HIGH (ref 3.8–10.8)

## 2023-10-03 LAB — HEMOGLOBIN A1C
Hgb A1c MFr Bld: 5.9 %{Hb} — ABNORMAL HIGH (ref <5.7)
Mean Plasma Glucose: 123 mg/dL
eAG (mmol/L): 6.8 mmol/L

## 2023-10-03 LAB — LIPID PANEL
Cholesterol: 168 mg/dL (ref <200)
HDL: 55 mg/dL (ref >=50)
LDL Cholesterol (Calc): 92 mg/dL
Non-HDL Cholesterol (Calc): 113 mg/dL (ref <130)
Total CHOL/HDL Ratio: 3.1 (calc) (ref <5.0)
Triglycerides: 118 mg/dL (ref <150)

## 2023-10-07 DIAGNOSIS — Z419 Encounter for procedure for purposes other than remedying health state, unspecified: Secondary | ICD-10-CM | POA: Diagnosis not present

## 2023-10-26 ENCOUNTER — Other Ambulatory Visit: Payer: Self-pay | Admitting: Internal Medicine

## 2023-10-26 MED ORDER — ATORVASTATIN CALCIUM 80 MG PO TABS: 80.0000 mg | ORAL_TABLET | Freq: Every day | ORAL | 0 refills | Status: DC

## 2023-10-27 ENCOUNTER — Other Ambulatory Visit: Payer: Self-pay | Admitting: Internal Medicine

## 2023-10-30 NOTE — Telephone Encounter (Signed)
 Requested Prescriptions  Pending Prescriptions Disp Refills   fenofibrate (TRICOR) 145 MG tablet [Pharmacy Med Name: FENOFIBRATE 145MG  TABLETS] 90 tablet 1    Sig: TAKE 1 TABLET(145 MG) BY MOUTH DAILY     Cardiovascular:  Antilipid - Fibric Acid Derivatives Failed - 10/30/2023 10:54 AM      Failed - ALT in normal range and within 360 days    ALT  Date Value Ref Range Status  10/02/2023 35 (H) 6 - 29 U/L Final         Failed - WBC in normal range and within 360 days    WBC  Date Value Ref Range Status  10/02/2023 13.2 (H) 3.8 - 10.8 Thousand/uL Final         Failed - Lipid Panel in normal range within the last 12 months    Cholesterol  Date Value Ref Range Status  10/02/2023 168 <200 mg/dL Final   LDL Cholesterol (Calc)  Date Value Ref Range Status  10/02/2023 92 mg/dL (calc) Final    Comment:    Reference range: <100 . Desirable range <100 mg/dL for primary prevention;   <70 mg/dL for patients with CHD or diabetic patients  with > or = 2 CHD risk factors. Marland Kitchen LDL-C is now calculated using the Martin-Hopkins  calculation, which is a validated novel method providing  better accuracy than the Friedewald equation in the  estimation of LDL-C.  Horald Pollen et al. Lenox Ahr. 1478;295(62): 2061-2068  (http://education.[facility].com/faq/FAQ164)    HDL  Date Value Ref Range Status  10/02/2023 55 > OR = 50 mg/dL Final   Triglycerides  Date Value Ref Range Status  10/02/2023 118 <150 mg/dL Final         Passed - AST in normal range and within 360 days    AST  Date Value Ref Range Status  10/02/2023 25 10 - 35 U/L Final         Passed - Cr in normal range and within 360 days    Creat  Date Value Ref Range Status  10/02/2023 1.00 0.50 - 1.03 mg/dL Final         Passed - HGB in normal range and within 360 days    Hemoglobin  Date Value Ref Range Status  10/02/2023 13.2 11.7 - 15.5 g/dL Final         Passed - HCT in normal range and within 360 days    HCT  Date  Value Ref Range Status  10/02/2023 40.0 35.0 - 45.0 % Final         Passed - PLT in normal range and within 360 days    Platelets  Date Value Ref Range Status  10/02/2023 284 140 - 400 Thousand/uL Final         Passed - eGFR is 30 or above and within 360 days    GFR, Est African American  Date Value Ref Range Status  01/14/2021 121 > OR = 60 mL/min/1.53m2 Final   GFR, Est Non African American  Date Value Ref Range Status  01/14/2021 105 > OR = 60 mL/min/1.51m2 Final   eGFR  Date Value Ref Range Status  10/02/2023 68 > OR = 60 mL/min/1.78m2 Final         Passed - Valid encounter within last 12 months    Recent Outpatient Visits           7 months ago Mixed hyperlipidemia   [place] [facility] [place], Salvadore Oxford, NP   1  year ago Encounter for general adult medical examination with abnormal findings   [facility] [place], Salvadore Oxford, NP   2 years ago Encounter for general adult medical examination with abnormal findings   [place] [place], Salvadore Oxford, NP   2 years ago Encounter for general adult medical examination with abnormal findings   [place] [place], Salvadore Oxford, NP   3 years ago Annual physical exam   [place] [name] [place], Jodelle Gross, FNP               atorvastatin (LIPITOR) 80 MG tablet [Pharmacy Med Name: ATORVASTATIN 80MG  TABLETS] 90 tablet 0    Sig: TAKE 1 TABLET(80 MG) BY MOUTH DAILY     Cardiovascular:  Antilipid - Statins Failed - 10/30/2023 10:54 AM      Failed - Lipid Panel in normal range within the last 12 months    Cholesterol  Date Value Ref Range Status  10/02/2023 168 <200 mg/dL Final   LDL Cholesterol (Calc)  Date Value Ref Range Status  10/02/2023 92 mg/dL (calc) Final    Comment:    Reference range: <100 . Desirable range <100 mg/dL for primary prevention;   <70 mg/dL for patients with CHD or diabetic patients  with >  or = 2 CHD risk factors. Marland Kitchen LDL-C is now calculated using the Martin-Hopkins  calculation, which is a validated novel method providing  better accuracy than the Friedewald equation in the  estimation of LDL-C.  Horald Pollen et al. Lenox Ahr. 4098;119(14): 2061-2068  (http://education.[facility].com/faq/FAQ164)    HDL  Date Value Ref Range Status  10/02/2023 55 > OR = 50 mg/dL Final   Triglycerides  Date Value Ref Range Status  10/02/2023 118 <150 mg/dL Final         Passed - Patient is not pregnant      Passed - Valid encounter within last 12 months    Recent Outpatient Visits           7 months ago Mixed hyperlipidemia   [place] [facility] [place], Salvadore Oxford, NP   1 year ago Encounter for general adult medical examination with abnormal findings   [facility] [place], Salvadore Oxford, NP   2 years ago Encounter for general adult medical examination with abnormal findings   [place] [facility] [place], Salvadore Oxford, NP   2 years ago Encounter for general adult medical examination with abnormal findings   [place] [facility] [place], Salvadore Oxford, NP   3 years ago Annual physical exam    [facility], Jodelle Gross, [place]

## 2023-11-18 DIAGNOSIS — Z419 Encounter for procedure for purposes other than remedying health state, unspecified: Secondary | ICD-10-CM | POA: Diagnosis not present

## 2023-12-18 DIAGNOSIS — Z419 Encounter for procedure for purposes other than remedying health state, unspecified: Secondary | ICD-10-CM | POA: Diagnosis not present

## 2024-01-18 DIAGNOSIS — Z419 Encounter for procedure for purposes other than remedying health state, unspecified: Secondary | ICD-10-CM | POA: Diagnosis not present

## 2024-01-26 ENCOUNTER — Other Ambulatory Visit: Payer: Self-pay | Admitting: Internal Medicine

## 2024-01-29 NOTE — Telephone Encounter (Signed)
 Requested Prescriptions  Pending Prescriptions Disp Refills   atorvastatin  (LIPITOR) 80 MG tablet [Pharmacy Med Name: ATORVASTATIN  80MG  TABLETS] 90 tablet 1    Sig: TAKE 1 TABLET(80 MG) BY MOUTH DAILY     Cardiovascular:  Antilipid - Statins Failed - 01/29/2024  4:25 PM      Failed - Lipid Panel in normal range within the last 12 months    Cholesterol  Date Value Ref Range Status  10/02/2023 168 <200 mg/dL Final   LDL Cholesterol (Calc)  Date Value Ref Range Status  10/02/2023 92 mg/dL (calc) Final    Comment:    Reference range: <100 . Desirable range <100 mg/dL for primary prevention;   <70 mg/dL for patients with CHD or diabetic patients  with > or = 2 CHD risk factors. SABRA LDL-C is now calculated using the Martin-Hopkins  calculation, which is a validated novel method providing  better accuracy than the Friedewald equation in the  estimation of LDL-C.  Gladis APPLETHWAITE et al. SANDREA. 7986;689(80): 2061-2068  (http://education.[facility].com/faq/FAQ164)    HDL  Date Value Ref Range Status  10/02/2023 55 > OR = 50 mg/dL Final   Triglycerides  Date Value Ref Range Status  10/02/2023 118 <150 mg/dL Final         Passed - Patient is not pregnant      Passed - Valid encounter within last 12 months    Recent Outpatient Visits           3 months ago Encounter for general adult medical examination with abnormal findings   [facility] [place], [name], NP

## 2024-02-17 DIAGNOSIS — Z419 Encounter for procedure for purposes other than remedying health state, unspecified: Secondary | ICD-10-CM | POA: Diagnosis not present

## 2024-03-08 ENCOUNTER — Telehealth: Admitting: Internal Medicine

## 2024-03-08 ENCOUNTER — Encounter: Payer: Self-pay | Admitting: Internal Medicine

## 2024-03-08 ENCOUNTER — Ambulatory Visit: Payer: Self-pay

## 2024-03-08 DIAGNOSIS — K1120 Sialoadenitis, unspecified: Secondary | ICD-10-CM | POA: Diagnosis not present

## 2024-03-08 DIAGNOSIS — B379 Candidiasis, unspecified: Secondary | ICD-10-CM | POA: Diagnosis not present

## 2024-03-08 MED ORDER — AMOXICILLIN-POT CLAVULANATE 875-125 MG PO TABS: 1.0000 | ORAL_TABLET | Freq: Two times a day (BID) | ORAL | 0 refills | Status: DC

## 2024-03-08 NOTE — Progress Notes (Signed)
 Virtual Visit via Video Note  I connected with Tiffany Benton on 03/08/24 at  4:00 PM EDT by a video enabled telemedicine application and verified that I am speaking with the correct person using two identifiers.  Location: Patient: Home Provider: Office  Person's participating in this video call: [name], NP-C and Madelyne Millikan   I discussed the limitations of evaluation and management by telemedicine and the availability of in person appointments. The patient expressed understanding and agreed to proceed.  History of Present Illness:  Discussed the use of AI scribe software for clinical note transcription with the patient, who gave verbal consent to proceed.  Tiffany Benton is a 53 year old female who presents with swelling in the left jaw.  She woke up this morning with swelling in her left jaw, described as a knot-like feel underneath but also mushy. It is tender when pressed but otherwise not painful. This is the same side where she has tinnitus in her ear, and she sometimes pushes on the area to relieve fuzziness. She is certain the swelling was not present yesterday.  No ear pain, runny nose, sinus pressure, nasal congestion, dental pain, or sore throat. She mentions a slight itchiness on the left side of her throat but no difficulty swallowing. No history of salivary gland stones or parotid inflammation, and her mouth feels as moist as usual.  Regarding her vision, she has an upcoming eye doctor appointment due to 'trash floating' in her left eye, described as blurry spots within her vision. She has had flares for a long time, but some have become blurry recently. No rash associated with these symptoms.      Past Medical History:  Diagnosis Date   Allergy    Frequent headaches    Migraine     Current Outpatient Medications  Medication Sig Dispense Refill   acetaminophen  (TYLENOL ) 500 MG tablet Take 500 mg by mouth every 6 (six) hours as needed.      aspirin  EC 81 MG tablet Take 1 tablet (81 mg total) by mouth daily. Swallow whole. 30 tablet 12   atorvastatin  (LIPITOR) 80 MG tablet TAKE 1 TABLET(80 MG) BY MOUTH DAILY 90 tablet 1   fenofibrate  (TRICOR ) 145 MG tablet TAKE 1 TABLET(145 MG) BY MOUTH DAILY 90 tablet 1   loratadine  (CLARITIN ) 10 MG tablet Take 1 tablet (10 mg total) by mouth daily. 90 tablet 1   omeprazole  (PRILOSEC) 20 MG capsule Take 1 capsule (20 mg total) by mouth daily. 90 capsule 1   No current facility-administered medications for this visit.    No Known Allergies  Family History  Problem Relation Age of Onset   Breast cancer Mother 65       metastatic   Cancer Mother    Bone cancer Maternal Grandfather    Colon cancer Neg Hx     Social History   Socioeconomic History   Marital status: Divorced    Spouse name: Not on file   Number of children: Not on file   Years of education: Not on file   Highest education level: GED or equivalent  Occupational History   Not on file  Tobacco Use   Smoking status: Every Day    Current packs/day: 0.25    Average packs/day: 0.3 packs/day for 25.0 years (6.3 ttl pk-yrs)    Types: Cigarettes   Smokeless tobacco: Never  Vaping Use   Vaping status: Never Used  Substance and Sexual Activity   Alcohol use: Not Currently  Drug use: No   Sexual activity: Not Currently    Birth control/protection: Abstinence    Comment: Tubes tied  Other Topics Concern   Not on file  Social History Narrative   Not on file   Social Drivers of Health   Financial Resource Strain: Patient Declined (09/28/2023)   Overall Financial Resource Strain (CARDIA)    Difficulty of Paying Living Expenses: Patient declined  Food Insecurity: No Food Insecurity (09/28/2023)   Hunger Vital Sign    Worried About Running Out of Food in the Last Year: Never true    Ran Out of Food in the Last Year: Never true  Transportation Needs: No Transportation Needs (09/28/2023)   PRAPARE - Therapist, art (Medical): No    Lack of Transportation (Non-Medical): No  Physical Activity: Unknown (09/28/2023)   Exercise Vital Sign    Days of Exercise per Week: 0 days    Minutes of Exercise per Session: Not on file  Stress: No Stress Concern Present (09/28/2023)   [employer] of Occupational Health - Occupational Stress Questionnaire    Feeling of Stress : Only a little  Social Connections: Socially Isolated (09/28/2023)   Social Connection and Isolation Panel    Frequency of Communication with Friends and Family: Once a week    Frequency of Social Gatherings with Friends and Family: Once a week    Attends Religious Services: 1 to 4 times per year    Active Member of [employer] or Organizations: No    Attends Engineer, structural: Not on file    Marital Status: Divorced  Intimate Partner Violence: Not on file     Constitutional: Denies fever, malaise, fatigue, headache or abrupt weight changes.  HEENT: Patient reports floaters in left eye.  Denies eye pain, eye redness, ear pain, ringing in the ears, wax buildup, runny nose, nasal congestion, bloody nose, or sore throat. Respiratory: Denies difficulty breathing, shortness of breath, cough or sputum production.   Cardiovascular: Denies chest pain, chest tightness, palpitations or swelling in the hands or feet.  Musculoskeletal: Patient reports swelling at the left side of the jaw.  Denies decrease in range of motion, difficulty with gait, muscle pain or joint pain and swelling.  Skin: Denies redness, rashes, lesions or ulcercations.   No other specific complaints in a complete review of systems (except as listed in HPI above).  Observations/Objective:   Wt Readings from Last 3 Encounters:  10/02/23 188 lb 6.4 oz (85.5 kg)  03/27/23 185 lb (83.9 kg)  09/27/22 186 lb (84.4 kg)    General: Appears her stated age, well developed, well nourished in NAD. Skin: No redness, rashes or lesions noted. HEENT: Head:  normal shape and size, no sinus tenderness reported;  Pulmonary/Chest: Normal effort. No respiratory distress.  Musculoskeletal: Mild swelling noted at the left side of the jaw with blunting of the angle of the jaw.   Neurological: Alert and oriented.   BMET    Component Value Date/Time   NA 141 10/02/2023 0814   K 4.2 10/02/2023 0814   CL 108 10/02/2023 0814   CO2 25 10/02/2023 0814   GLUCOSE 110 (H) 10/02/2023 0814   BUN 18 10/02/2023 0814   CREATININE 1.00 10/02/2023 0814   CALCIUM  9.9 10/02/2023 0814   GFRNONAA 105 01/14/2021 1026   GFRAA 121 01/14/2021 1026    Lipid Panel     Component Value Date/Time   CHOL 168 10/02/2023 0814   TRIG 118 10/02/2023 0814  HDL 55 10/02/2023 0814   CHOLHDL 3.1 10/02/2023 0814   LDLCALC 92 10/02/2023 0814    CBC    Component Value Date/Time   WBC 13.2 (H) 10/02/2023 0814   RBC 4.22 10/02/2023 0814   HGB 13.2 10/02/2023 0814   HCT 40.0 10/02/2023 0814   PLT 284 10/02/2023 0814   MCV 94.8 10/02/2023 0814   MCH 31.3 10/02/2023 0814   MCHC 33.0 10/02/2023 0814   RDW 12.0 10/02/2023 0814   LYMPHSABS 2,407 01/10/2020 1045   EOSABS 143 01/10/2020 1045   BASOSABS 71 01/10/2020 1045    Hgb A1C Lab Results  Component Value Date   HGBA1C 5.9 (H) 10/02/2023       Assessment and Plan:  Assessment and Plan    Left parotid gland swelling (sialoadenitis) Acute left parotid swelling likely due to infection or obstruction. Differential includes salivary gland infection or blockage. Outpatient antibiotics warranted to prevent progression. - Prescribed Augmentin 875 mg/125 mg BID for 10 days. - Advised OTC Monistat 7-day treatment if yeast infection develops. - Instructed to seek emergency care if symptoms worsen over the weekend.     RTC in 1 month for followup chronic conditions Follow Up Instructions:    I discussed the assessment and treatment plan with the patient. The patient was provided an opportunity to ask questions  and all were answered. The patient agreed with the plan and demonstrated an understanding of the instructions.   The patient was advised to call back or seek an in-person evaluation if the symptoms worsen or if the condition fails to improve as anticipated.   [name], NP

## 2024-03-08 NOTE — Patient Instructions (Signed)
 Parotitis  Parotitis means that you have irritation and swelling (inflammation) in one or both of your parotid glands. These glands make saliva. They are found on each side of your face, below and in front of your earlobes. You may or may not have pain with this condition. What are the causes? This condition may be caused by: Infections from germs (bacteria or viruses). Something blocking the flow of saliva through the parotid glands. This can be a stone, scar tissue, or a tumor. Diseases that cause your body's defense system (immune system) to attack healthy cells in your salivary glands. These are called autoimmune diseases. What increases the risk? Being 75 years old or older. Not drinking enough fluids (being dehydrated). Drinking too much alcohol. Having: A dry mouth. Diabetes. Gout. A long-term illness. Not taking good care of your mouth and teeth (poor oral hygiene). Having had radiation treatments to the head and neck. Taking certain medicines. What are the signs or symptoms? Swelling under and in front of the ear. This may get worse after you eat. Pain and tenderness over the parotid gland. This may get worse after you eat. Redness and warmth of the skin over the parotid gland. Fever or chills. Pus coming from the ducts inside the mouth. Dry mouth. A bad taste in the mouth. How is this treated? Treatment depends on the cause. It may include: Antibiotic medicine for an infection from bacteria. NSAIDs, such as ibuprofen, to treat pain and swelling. Drinking more fluids. Removing a stone or obstruction. Treating a disease that is causing parotitis. Surgery to drain an infection, remove a growth, or remove the whole gland. Treatment may not be needed if the swelling goes away with home care. Follow these instructions at home: Medicines  Take over-the-counter and prescription medicines only as told by your doctor. If you were prescribed an antibiotic medicine, take it as  told by your doctor. Do not stop taking it even if you start to feel better. Managing pain and swelling If told, put heat on the affected area. Do this as often as told by your doctor. Use the heat source that your doctor recommends, such as a moist heat pack or a heating pad. Place a towel between your skin and the heat source. Leave the heat on for 20-30 minutes. Take off the heat if your skin turns bright red. This is very important. If you cannot feel pain, heat, or cold, you have a greater risk of getting burned. Gargle with salt water 3-4 times a day or as needed. To make salt water, dissolve -1 tsp (3-6 g) of salt in 1 cup (237 mL) of warm water. Gently rub your parotid glands as told by your doctor. General instructions  Drink enough fluid to keep your pee (urine) pale yellow. Keep your mouth clean and moist. Suck on sour candy. This may help to: Make your mouth less dry. Make more saliva. Take good care of your mouth: Brush your teeth at least two times a day. Floss your teeth every day. See your dentist regularly. Do not smoke or use any products that contain nicotine or tobacco. If you need help quitting, ask your doctor. Do not drink alcohol. Keep all follow-up visits. Contact a doctor if: You have a fever or chills. You have new symptoms. Your symptoms get worse. Your symptoms do not get better with treatment. Get help right away if: You have trouble breathing or swallowing. These symptoms may be an emergency. Get help right away. Call your  local emergency services (911 in the [place] Do not wait to see if the symptoms will go away. Do not drive yourself to the hospital. Summary Parotitis means that you have irritation and swelling (inflammation) in one or both of your parotid glands. Symptoms include pain and swelling under and in front of the ear. Treatment for parotitis depends on the cause. In some cases, the condition may go away on its own with home care. You  should drink plenty of fluids, take good care of your mouth, and do not use products that contain nicotine or tobacco. This information is not intended to replace advice given to you by your health care provider. Make sure you discuss any questions you have with your health care provider. Document Revised: 12/04/2020 Document Reviewed: 12/04/2020 Elsevier Patient Education  2024 [employer].

## 2024-03-08 NOTE — Telephone Encounter (Signed)
 Will discuss at upcoming appointment

## 2024-03-08 NOTE — Telephone Encounter (Signed)
 FYI Only or Action Required?: Action required by provider: request for appointment.  Patient was last seen in primary care on 10/02/2023 by Antonette [name], NP.  Called Nurse Triage reporting Facial Swelling.  Symptoms began today.  Interventions attempted: Nothing.  Symptoms are: unchanged. Woke up with left side jaw swelling ar ear level. No pain or itching.  Triage Disposition: See Physician Within 24 Hours  Patient/caregiver understands and will follow disposition?: Yes   Copied from CRM #[id number]. Topic: Clinical - Red Word Triage >> Mar 08, 2024 11:54 AM Tonda B wrote: [facility] that prompted transfer to Nurse Triage: patient has swelling on left side of face Reason for Disposition  Face swelling is painful to touch  Answer Assessment - Initial Assessment Questions 1. ONSET: When did the swelling start? (e.g., minutes, hours, days)     Woke up with it 2. LOCATION: What part of the face is swollen? (e.g., cheek, entire face, jaw joint area, under jaw)     Left side, jaw and ear 3. SEVERITY: How swollen is it?     moderate 4. ITCHING: Is there any itching? If Yes, ask: How much?   (Scale 1-10; mild, moderate or severe)     no 5. PAIN: Is the swelling painful to touch? If Yes, ask: How painful is it?   (Scale 0-10; mild, moderate or severe)     no 6. FEVER: Do you have a fever? If Yes, ask: What is it, how was it measured, and when did it start?      no 7. CAUSE: What do you think is causing the face swelling?     unsure 8. NEW MEDICINES: Have there been any new medicines started recently?     no 9. RECURRENT SYMPTOM: Have you had face swelling before? If Yes, ask: When was the last time? What happened that time?     no 10. OTHER SYMPTOMS: Do you have any other symptoms? (e.g., leg swelling, toothache)       no 11. PREGNANCY: Is there any chance you are pregnant? When was your last menstrual period?       no  Protocols used: Face  Swelling-A-AH

## 2024-03-13 DIAGNOSIS — H5213 Myopia, bilateral: Secondary | ICD-10-CM | POA: Diagnosis not present

## 2024-03-14 DIAGNOSIS — H5213 Myopia, bilateral: Secondary | ICD-10-CM | POA: Diagnosis not present

## 2024-03-19 ENCOUNTER — Other Ambulatory Visit: Payer: Self-pay | Admitting: Internal Medicine

## 2024-03-19 DIAGNOSIS — Z419 Encounter for procedure for purposes other than remedying health state, unspecified: Secondary | ICD-10-CM | POA: Diagnosis not present

## 2024-03-22 NOTE — Telephone Encounter (Signed)
 Requested Prescriptions  Pending Prescriptions Disp Refills   omeprazole  (PRILOSEC) 20 MG capsule [Pharmacy Med Name: OMEPRAZOLE  20MG  CAPSULES] 90 capsule 1    Sig: TAKE 1 CAPSULE(20 MG) BY MOUTH DAILY     Gastroenterology: Proton Pump Inhibitors Passed - 03/22/2024 11:28 AM      Passed - Valid encounter within last 12 months    Recent Outpatient Visits           2 weeks ago Parotitis   [facility] [place], [name], NP   5 months ago Encounter for general adult medical examination with abnormal findings   [facility] [place], [name], NP

## 2024-03-23 ENCOUNTER — Other Ambulatory Visit: Payer: Self-pay | Admitting: Internal Medicine

## 2024-04-02 ENCOUNTER — Ambulatory Visit: Payer: Medicaid Other | Admitting: Internal Medicine

## 2024-04-02 ENCOUNTER — Encounter: Payer: Self-pay | Admitting: Internal Medicine

## 2024-04-02 VITALS — BP 134/82 | Ht 66.0 in | Wt 201.4 lb

## 2024-04-02 DIAGNOSIS — E6609 Other obesity due to excess calories: Secondary | ICD-10-CM | POA: Diagnosis not present

## 2024-04-02 DIAGNOSIS — E66811 Obesity, class 1: Secondary | ICD-10-CM

## 2024-04-02 DIAGNOSIS — R748 Abnormal levels of other serum enzymes: Secondary | ICD-10-CM | POA: Diagnosis not present

## 2024-04-02 DIAGNOSIS — D72829 Elevated white blood cell count, unspecified: Secondary | ICD-10-CM | POA: Insufficient documentation

## 2024-04-02 DIAGNOSIS — Z87898 Personal history of other specified conditions: Secondary | ICD-10-CM

## 2024-04-02 DIAGNOSIS — K219 Gastro-esophageal reflux disease without esophagitis: Secondary | ICD-10-CM | POA: Diagnosis not present

## 2024-04-02 DIAGNOSIS — R7303 Prediabetes: Secondary | ICD-10-CM

## 2024-04-02 DIAGNOSIS — I700 Atherosclerosis of aorta: Secondary | ICD-10-CM | POA: Diagnosis not present

## 2024-04-02 DIAGNOSIS — Z780 Asymptomatic menopausal state: Secondary | ICD-10-CM | POA: Diagnosis not present

## 2024-04-02 DIAGNOSIS — G8929 Other chronic pain: Secondary | ICD-10-CM | POA: Diagnosis not present

## 2024-04-02 DIAGNOSIS — E782 Mixed hyperlipidemia: Secondary | ICD-10-CM

## 2024-04-02 DIAGNOSIS — M5442 Lumbago with sciatica, left side: Secondary | ICD-10-CM

## 2024-04-02 NOTE — Patient Instructions (Signed)

## 2024-04-02 NOTE — Assessment & Plan Note (Signed)
 Encourage diet and exercise for weight loss

## 2024-04-02 NOTE — Assessment & Plan Note (Signed)
 Encouraged weight loss as this can help reduce reflux symptoms Avoid foods that trigger your reflux Continue omeprazole  20 mg daily- she declines weaning at this time

## 2024-04-02 NOTE — Assessment & Plan Note (Signed)
 Encouraged smoking cessation

## 2024-04-02 NOTE — Assessment & Plan Note (Signed)
 C-Met today We will continue to monitor at this time If liver enzymes worsen, would consider RUQ abdominal ultrasound

## 2024-04-02 NOTE — Progress Notes (Signed)
 Subjective:    Patient ID: Tiffany Benton, female    DOB: September 30, 1970, 53 y.o.   MRN: [id number]  HPI  Patient presents to clinic today for 20-month follow-up of chronic conditions.  HLD with aortic atherosclerosis: Her last LDL was 92, triglycerides 881, 09/2023.  She denies myalgias on atorvastatin  and fenofibrate .  She is taking aspirin  as well.  She tries to consume a low-fat diet.  Chronic back pain: s/p surgical intervention. Flares when she is overactive. Managed with tylenol  OTC.  There is no imaging on file.  She does not follow with orthopedics.  Prediabetes: Her last A1c was 5.9%, 09/2023.  She is not taking any oral diabetic medication at this time.  She does not check her sugars.  Leukocytosis: Her last WBC count was 13.2, 09/2023.  She does smoke. Prenvar never. She has never had lung cancer screening. She does not follow with hematology.  Elevated liver enzymes: Her last AST/ALT was 25/35, 09/2023.  CT abdomen/pelvis from 12/2017 did not give any indication as to why liver enzymes were elevated.  GERD: Triggered by tomato based foods.  She denies breakthrough on omeprazole .  There is no upper GI on file.  She also reports a rash of her breast. She noticed this 1 month ago. She reports it does not itch, burn or drain. She has tried putting powder on the area but it has not seemed to help.  Review of Systems     Past Medical History:  Diagnosis Date   Allergy    Frequent headaches    Migraine     Current Outpatient Medications  Medication Sig Dispense Refill   acetaminophen  (TYLENOL ) 500 MG tablet Take 500 mg by mouth every 6 (six) hours as needed.     amoxicillin -clavulanate (AUGMENTIN ) 875-125 MG tablet Take 1 tablet by mouth 2 (two) times daily. 20 tablet 0   aspirin  EC 81 MG tablet Take 1 tablet (81 mg total) by mouth daily. Swallow whole. 30 tablet 12   atorvastatin  (LIPITOR) 80 MG tablet TAKE 1 TABLET(80 MG) BY MOUTH DAILY 90 tablet 1   fenofibrate   (TRICOR ) 145 MG tablet TAKE 1 TABLET(145 MG) BY MOUTH DAILY 90 tablet 1   loratadine  (CLARITIN ) 10 MG tablet Take 1 tablet (10 mg total) by mouth daily. 90 tablet 1   omeprazole  (PRILOSEC) 20 MG capsule TAKE 1 CAPSULE(20 MG) BY MOUTH DAILY 90 capsule 1   No current facility-administered medications for this visit.    No Known Allergies  Family History  Problem Relation Age of Onset   Breast cancer Mother 45       metastatic   Cancer Mother    Bone cancer Maternal Grandfather    Colon cancer Neg Hx     Social History   Socioeconomic History   Marital status: Divorced    Spouse name: Not on file   Number of children: Not on file   Years of education: Not on file   Highest education level: GED or equivalent  Occupational History   Not on file  Tobacco Use   Smoking status: Every Day    Current packs/day: 0.25    Average packs/day: 0.3 packs/day for 25.0 years (6.3 ttl pk-yrs)    Types: Cigarettes   Smokeless tobacco: Never  Vaping Use   Vaping status: Never Used  Substance and Sexual Activity   Alcohol use: Not Currently   Drug use: No   Sexual activity: Not Currently    Birth control/protection: Abstinence  Comment: Tubes tied  Other Topics Concern   Not on file  Social History Narrative   Not on file   Social Drivers of Health   Financial Resource Strain: Low Risk  (04/01/2024)   Overall Financial Resource Strain (CARDIA)    Difficulty of Paying Living Expenses: Not hard at all  Food Insecurity: No Food Insecurity (04/01/2024)   Hunger Vital Sign    Worried About Running Out of Food in the Last Year: Never true    Ran Out of Food in the Last Year: Never true  Transportation Needs: No Transportation Needs (04/01/2024)   PRAPARE - Administrator, Civil Service (Medical): No    Lack of Transportation (Non-Medical): No  Physical Activity: Inactive (04/01/2024)   Exercise Vital Sign    Days of Exercise per Week: 0 days    Minutes of Exercise per  Session: Not on file  Stress: No Stress Concern Present (04/01/2024)   [employer] of Occupational Health - Occupational Stress Questionnaire    Feeling of Stress: Only a little  Social Connections: Moderately Isolated (04/01/2024)   Social Connection and Isolation Panel    Frequency of Communication with Friends and Family: More than three times a week    Frequency of Social Gatherings with Friends and Family: More than three times a week    Attends Religious Services: 1 to 4 times per year    Active Member of [employer] or Organizations: No    Attends Engineer, structural: Not on file    Marital Status: Divorced  Intimate Partner Violence: Not on file     Constitutional: Denies fever, malaise, fatigue, headache or abrupt weight changes.  HEENT: Denies eye pain, eye redness, ear pain, ringing in the ears, wax buildup, runny nose, nasal congestion, bloody nose, or sore throat. Respiratory: Denies difficulty breathing, shortness of breath, cough or sputum production.   Cardiovascular: Denies chest pain, chest tightness, palpitations or swelling in the hands or feet.  Gastrointestinal: Denies abdominal pain, bloating, constipation, diarrhea or blood in the stool.  GU: Denies urgency, frequency, pain with urination, burning sensation, blood in urine, odor or discharge. Musculoskeletal: Patient reports chronic back pain.  Denies decrease in range of motion, difficulty with gait, or joint swelling.  Skin: Pt reports rash of breast. Denies lesions or ulcercations.  Neurological: Pt reports hot flashes, paresthesia of left leg. Denies dizziness, difficulty with memory, difficulty with speech or problems with balance and coordination.  Psych: Denies anxiety, depression, SI/HI.  No other specific complaints in a complete review of systems (except as listed in HPI above).  Objective:   Physical Exam  BP 134/82 (BP Location: Left Arm, Patient Position: Sitting, Cuff Size: Normal)   Ht  5' 6 (1.676 m)   Wt 201 lb 6.4 oz (91.4 kg)   LMP 12/24/2020 (Approximate) Comment: tubal  BMI 32.51 kg/m     Wt Readings from Last 3 Encounters:  10/02/23 188 lb 6.4 oz (85.5 kg)  03/27/23 185 lb (83.9 kg)  09/27/22 186 lb (84.4 kg)    General: Appears her stated age, obese, in NAD. Skin: Warm, dry and intact. Non blanching telangectasia noted of bilateral breasts. HEENT: Head: normal shape and size; Eyes: sclera white, no icterus, conjunctiva pink, PERRLA and EOMs intact;  Cardiovascular: Normal rate and rhythm. S1,S2 noted.  No murmur, rubs or gallops noted. No JVD or BLE edema. No carotid bruits noted. Pulmonary/Chest: Normal effort and positive vesicular breath sounds. No respiratory distress. No wheezes, rales  or ronchi noted.  Abdomen: Soft, nontender Musculoskeletal: No pain with palpation over the lumbar spine. No difficulty with gait.  Neurological: Alert and oriented. Coordination normal.  Psychiatric: Mood and affect normal. Behavior is normal. Judgment and thought content normal.     BMET    Component Value Date/Time   NA 141 10/02/2023 0814   K 4.2 10/02/2023 0814   CL 108 10/02/2023 0814   CO2 25 10/02/2023 0814   GLUCOSE 110 (H) 10/02/2023 0814   BUN 18 10/02/2023 0814   CREATININE 1.00 10/02/2023 0814   CALCIUM  9.9 10/02/2023 0814   GFRNONAA 105 01/14/2021 1026   GFRAA 121 01/14/2021 1026    Lipid Panel     Component Value Date/Time   CHOL 168 10/02/2023 0814   TRIG 118 10/02/2023 0814   HDL 55 10/02/2023 0814   CHOLHDL 3.1 10/02/2023 0814   LDLCALC 92 10/02/2023 0814    CBC    Component Value Date/Time   WBC 13.2 (H) 10/02/2023 0814   RBC 4.22 10/02/2023 0814   HGB 13.2 10/02/2023 0814   HCT 40.0 10/02/2023 0814   PLT 284 10/02/2023 0814   MCV 94.8 10/02/2023 0814   MCH 31.3 10/02/2023 0814   MCHC 33.0 10/02/2023 0814   RDW 12.0 10/02/2023 0814   LYMPHSABS 2,407 01/10/2020 1045   EOSABS 143 01/10/2020 1045   BASOSABS 71 01/10/2020  1045    Hgb A1C Lab Results  Component Value Date   HGBA1C 5.9 (H) 10/02/2023           Assessment & Plan:   Rash of breast:  Will obtain diagnostic mammogram at this time  RTC in 6 months for your annual exam [name], NP

## 2024-04-02 NOTE — Assessment & Plan Note (Signed)
 Continue estroven OTC

## 2024-04-02 NOTE — Assessment & Plan Note (Signed)
 Encouraged regular stretching and core strengthening Continue tylenol  OTC

## 2024-04-02 NOTE — Assessment & Plan Note (Signed)
 A1c today Encourage low-carb diet and exercise for weight loss

## 2024-04-02 NOTE — Assessment & Plan Note (Signed)
 C-Met and lipid profile today Encouraged her to consume a low-fat diet Continue atorvastatin  80 mg, fenofibrate  145 mg and aspirin  81 mg daily Insurance would not cover Repatha 

## 2024-04-03 ENCOUNTER — Other Ambulatory Visit: Payer: Self-pay | Admitting: Internal Medicine

## 2024-04-03 ENCOUNTER — Ambulatory Visit: Payer: Self-pay | Admitting: Internal Medicine

## 2024-04-03 DIAGNOSIS — N63 Unspecified lump in unspecified breast: Secondary | ICD-10-CM

## 2024-04-03 DIAGNOSIS — Z87898 Personal history of other specified conditions: Secondary | ICD-10-CM

## 2024-04-03 DIAGNOSIS — I700 Atherosclerosis of aorta: Secondary | ICD-10-CM

## 2024-04-03 DIAGNOSIS — I781 Nevus, non-neoplastic: Secondary | ICD-10-CM

## 2024-04-03 DIAGNOSIS — E782 Mixed hyperlipidemia: Secondary | ICD-10-CM

## 2024-04-03 LAB — COMPREHENSIVE METABOLIC PANEL WITH GFR
AG Ratio: 2 (calc) (ref 1.0–2.5)
ALT: 49 U/L — ABNORMAL HIGH (ref 6–29)
AST: 28 U/L (ref 10–35)
Albumin: 4.5 g/dL (ref 3.6–5.1)
Alkaline phosphatase (APISO): 71 U/L (ref 37–153)
BUN: 18 mg/dL (ref 7–25)
CO2: 25 mmol/L (ref 20–32)
Calcium: 9.6 mg/dL (ref 8.6–10.4)
Chloride: 107 mmol/L (ref 98–110)
Creat: 0.78 mg/dL (ref 0.50–1.03)
Globulin: 2.3 g/dL (ref 1.9–3.7)
Glucose, Bld: 127 mg/dL — ABNORMAL HIGH (ref 65–99)
Potassium: 4.3 mmol/L (ref 3.5–5.3)
Sodium: 140 mmol/L (ref 135–146)
Total Bilirubin: 0.4 mg/dL (ref 0.2–1.2)
Total Protein: 6.8 g/dL (ref 6.1–8.1)
eGFR: 91 mL/min/1.73m2 (ref >=60)

## 2024-04-03 LAB — CBC
HCT: 43.5 % (ref 35.0–45.0)
Hemoglobin: 14.1 g/dL (ref 11.7–15.5)
MCH: 30.9 pg (ref 27.0–33.0)
MCHC: 32.4 g/dL (ref 32.0–36.0)
MCV: 95.4 fL (ref 80.0–100.0)
MPV: 11.5 fL (ref 7.5–12.5)
Platelets: 287 Thousand/uL (ref 140–400)
RBC: 4.56 Million/uL (ref 3.80–5.10)
RDW: 12.8 % (ref 11.0–15.0)
WBC: 8.8 Thousand/uL (ref 3.8–10.8)

## 2024-04-03 LAB — LIPID PANEL
Cholesterol: 193 mg/dL (ref <200)
HDL: 47 mg/dL — ABNORMAL LOW (ref >=50)
LDL Cholesterol (Calc): 119 mg/dL — ABNORMAL HIGH
Non-HDL Cholesterol (Calc): 146 mg/dL — ABNORMAL HIGH (ref <130)
Total CHOL/HDL Ratio: 4.1 (calc) (ref <5.0)
Triglycerides: 152 mg/dL — ABNORMAL HIGH (ref <150)

## 2024-04-03 LAB — HEMOGLOBIN A1C
Hgb A1c MFr Bld: 6.1 % — ABNORMAL HIGH (ref <5.7)
Mean Plasma Glucose: 128 mg/dL
eAG (mmol/L): 7.1 mmol/L

## 2024-04-03 MED ORDER — PRALUENT 75 MG/ML ~~LOC~~ SOAJ
75.0000 mg | SUBCUTANEOUS | 0 refills | Status: DC
Start: 2024-04-03 — End: 2024-04-10

## 2024-04-05 ENCOUNTER — Telehealth: Payer: Self-pay

## 2024-04-05 ENCOUNTER — Other Ambulatory Visit (HOSPITAL_COMMUNITY): Payer: Self-pay

## 2024-04-05 NOTE — Telephone Encounter (Signed)
 Pharmacy Patient Advocate Encounter   Received notification from Pt Calls Messages that prior authorization for Praluent  75MG /ML auto-injectors is required/requested.   Insurance verification completed.   The patient is insured through [facility] MEDICAID .   Per test claim: PA required; PA submitted to above mentioned insurance via Latent Key/confirmation #/EOC [id number] Status is pending

## 2024-04-05 NOTE — Telephone Encounter (Signed)
 Copied from CRM [id number]. Topic: Clinical - Medication Prior Auth >> Apr 05, 2024 12:18 PM Avram MATSU wrote: Reason for CRM: Philippe is calling to check the status of PA. She would like a callback once the status has been updated for praluent . Please advise [phone]

## 2024-04-09 ENCOUNTER — Telehealth: Payer: Self-pay

## 2024-04-09 NOTE — Telephone Encounter (Signed)
 Pharmacy Patient Advocate Encounter  Received notification from [facility] MEDICAID that Prior Authorization for Praluent  75MG /ML auto-injectors  has been DENIED.  See denial reason below. No denial letter attached in CMM. Will attach denial letter to Media tab once received.   PA #/Case ID/Reference #: [id number] .

## 2024-04-09 NOTE — Telephone Encounter (Signed)
 Copied from CRM [id number]. Topic: Clinical - Medication Prior Auth >> Apr 05, 2024 12:18 PM Avram MATSU wrote: Reason for CRM: Philippe is calling to check the status of PA. She would like a callback once the status has been updated for praluent . Please advise [phone]

## 2024-04-10 ENCOUNTER — Other Ambulatory Visit (HOSPITAL_COMMUNITY): Payer: Self-pay

## 2024-04-10 MED ORDER — EZETIMIBE 10 MG PO TABS: 10.0000 mg | ORAL_TABLET | Freq: Every day | ORAL | 1 refills | Status: AC

## 2024-04-10 NOTE — Addendum Note (Signed)
 Addended by: ANTONETTE [name] on: 04/10/2024 10:21 AM   Modules accepted: Orders

## 2024-04-11 ENCOUNTER — Ambulatory Visit
Admission: RE | Admit: 2024-04-11 | Discharge: 2024-04-11 | Disposition: A | Discharge status: Home / Self Care | Source: Ambulatory Visit | Attending: Internal Medicine | Admitting: Internal Medicine

## 2024-04-11 DIAGNOSIS — R21 Rash and other nonspecific skin eruption: Secondary | ICD-10-CM | POA: Diagnosis not present

## 2024-04-11 DIAGNOSIS — N63 Unspecified lump in unspecified breast: Secondary | ICD-10-CM | POA: Diagnosis not present

## 2024-04-11 DIAGNOSIS — Z87898 Personal history of other specified conditions: Secondary | ICD-10-CM | POA: Insufficient documentation

## 2024-04-11 DIAGNOSIS — R92333 Mammographic heterogeneous density, bilateral breasts: Secondary | ICD-10-CM | POA: Diagnosis not present

## 2024-04-11 DIAGNOSIS — N6321 Unspecified lump in the left breast, upper outer quadrant: Secondary | ICD-10-CM | POA: Diagnosis not present

## 2024-04-11 DIAGNOSIS — I781 Nevus, non-neoplastic: Secondary | ICD-10-CM | POA: Insufficient documentation

## 2024-04-19 DIAGNOSIS — Z419 Encounter for procedure for purposes other than remedying health state, unspecified: Secondary | ICD-10-CM | POA: Diagnosis not present

## 2024-04-27 ENCOUNTER — Other Ambulatory Visit: Payer: Self-pay | Admitting: Internal Medicine

## 2024-04-29 NOTE — Telephone Encounter (Signed)
 Requested Prescriptions  Pending Prescriptions Disp Refills   fenofibrate  (TRICOR ) 145 MG tablet [Pharmacy Med Name: FENOFIBRATE  145MG  TABLETS] 90 tablet 1    Sig: TAKE 1 TABLET(145 MG) BY MOUTH DAILY     Cardiovascular:  Antilipid - Fibric Acid Derivatives Failed - 04/29/2024  2:14 PM      Failed - ALT in normal range and within 360 days    ALT  Date Value Ref Range Status  04/02/2024 49 (H) 6 - 29 U/L Final         Failed - Lipid Panel in normal range within the last 12 months    Cholesterol  Date Value Ref Range Status  04/02/2024 193 <200 mg/dL Final   LDL Cholesterol (Calc)  Date Value Ref Range Status  04/02/2024 119 (H) mg/dL (calc) Final    Comment:    Reference range: <100 . Desirable range <100 mg/dL for primary prevention;   <70 mg/dL for patients with CHD or diabetic patients  with > or = 2 CHD risk factors. SABRA LDL-C is now calculated using the Martin-Hopkins  calculation, which is a validated novel method providing  better accuracy than the Friedewald equation in the  estimation of LDL-C.  Gladis APPLETHWAITE et al. SANDREA. 7986;689(80): 2061-2068  (http://education.[facility].com/faq/FAQ164)    HDL  Date Value Ref Range Status  04/02/2024 47 (L) > OR = 50 mg/dL Final   Triglycerides  Date Value Ref Range Status  04/02/2024 152 (H) <150 mg/dL Final         Passed - AST in normal range and within 360 days    AST  Date Value Ref Range Status  04/02/2024 28 10 - 35 U/L Final         Passed - Cr in normal range and within 360 days    Creat  Date Value Ref Range Status  04/02/2024 0.78 0.50 - 1.03 mg/dL Final         Passed - HGB in normal range and within 360 days    Hemoglobin  Date Value Ref Range Status  04/02/2024 14.1 11.7 - 15.5 g/dL Final         Passed - HCT in normal range and within 360 days    HCT  Date Value Ref Range Status  04/02/2024 43.5 35.0 - 45.0 % Final         Passed - PLT in normal range and within 360 days    Platelets   Date Value Ref Range Status  04/02/2024 287 140 - 400 Thousand/uL Final         Passed - WBC in normal range and within 360 days    WBC  Date Value Ref Range Status  04/02/2024 8.8 3.8 - 10.8 Thousand/uL Final         Passed - eGFR is 30 or above and within 360 days    GFR, Est African American  Date Value Ref Range Status  01/14/2021 121 > OR = 60 mL/min/1.70m2 Final   GFR, Est Non African American  Date Value Ref Range Status  01/14/2021 105 > OR = 60 mL/min/1.16m2 Final   eGFR  Date Value Ref Range Status  04/02/2024 91 > OR = 60 mL/min/1.84m2 Final         Passed - Valid encounter within last 12 months    Recent Outpatient Visits           3 weeks ago Prediabetes   [facility] [name], [name], [name]  1 month ago Parotitis   [place] [facility] [name], [name], NP   7 months ago Encounter for general adult medical examination with abnormal findings   [place] [facility] [name], [name], [name]

## 2024-06-19 DIAGNOSIS — Z419 Encounter for procedure for purposes other than remedying health state, unspecified: Secondary | ICD-10-CM | POA: Diagnosis not present

## 2024-07-31 ENCOUNTER — Other Ambulatory Visit: Payer: Self-pay | Admitting: Internal Medicine

## 2024-09-13 ENCOUNTER — Other Ambulatory Visit: Payer: Self-pay | Admitting: Internal Medicine

## 2024-10-02 ENCOUNTER — Encounter: Admitting: Internal Medicine
